# Patient Record
Sex: Male | Born: 1950 | Race: White | Hispanic: No | Marital: Married | Smoking: Former smoker
Health system: Southern US, Community
[De-identification: ages and names within clinical notes are randomized; demographics above are authoritative.]

## PROBLEM LIST (undated history)

## (undated) DIAGNOSIS — I10 Essential (primary) hypertension: Secondary | ICD-10-CM

## (undated) DIAGNOSIS — G629 Polyneuropathy, unspecified: Secondary | ICD-10-CM

## (undated) DIAGNOSIS — E119 Type 2 diabetes mellitus without complications: Secondary | ICD-10-CM

## (undated) DIAGNOSIS — I340 Nonrheumatic mitral (valve) insufficiency: Secondary | ICD-10-CM

## (undated) HISTORY — PX: BACK SURGERY: SHX140

---

## 2017-02-04 ENCOUNTER — Emergency Department (HOSPITAL_BASED_OUTPATIENT_CLINIC_OR_DEPARTMENT_OTHER): Payer: Medicare PPO

## 2017-02-04 ENCOUNTER — Observation Stay (HOSPITAL_BASED_OUTPATIENT_CLINIC_OR_DEPARTMENT_OTHER)
Admission: EM | Admit: 2017-02-04 | Discharge: 2017-02-06 | Disposition: A | Discharge status: Home / Self Care | Payer: Medicare PPO | Attending: Internal Medicine | Admitting: Internal Medicine

## 2017-02-04 ENCOUNTER — Encounter (HOSPITAL_BASED_OUTPATIENT_CLINIC_OR_DEPARTMENT_OTHER): Payer: Self-pay | Admitting: Emergency Medicine

## 2017-02-04 DIAGNOSIS — Z885 Allergy status to narcotic agent status: Secondary | ICD-10-CM | POA: Diagnosis not present

## 2017-02-04 DIAGNOSIS — Z880 Allergy status to penicillin: Secondary | ICD-10-CM | POA: Diagnosis not present

## 2017-02-04 DIAGNOSIS — J449 Chronic obstructive pulmonary disease, unspecified: Secondary | ICD-10-CM | POA: Insufficient documentation

## 2017-02-04 DIAGNOSIS — I252 Old myocardial infarction: Secondary | ICD-10-CM | POA: Diagnosis not present

## 2017-02-04 DIAGNOSIS — I959 Hypotension, unspecified: Secondary | ICD-10-CM | POA: Diagnosis not present

## 2017-02-04 DIAGNOSIS — Z87891 Personal history of nicotine dependence: Secondary | ICD-10-CM | POA: Insufficient documentation

## 2017-02-04 DIAGNOSIS — R55 Syncope and collapse: Secondary | ICD-10-CM | POA: Diagnosis present

## 2017-02-04 DIAGNOSIS — F32A Depression, unspecified: Secondary | ICD-10-CM | POA: Diagnosis present

## 2017-02-04 DIAGNOSIS — Z6836 Body mass index (BMI) 36.0-36.9, adult: Secondary | ICD-10-CM | POA: Insufficient documentation

## 2017-02-04 DIAGNOSIS — Y92009 Unspecified place in unspecified non-institutional (private) residence as the place of occurrence of the external cause: Secondary | ICD-10-CM | POA: Diagnosis not present

## 2017-02-04 DIAGNOSIS — I2510 Atherosclerotic heart disease of native coronary artery without angina pectoris: Secondary | ICD-10-CM | POA: Diagnosis not present

## 2017-02-04 DIAGNOSIS — S92911A Unspecified fracture of right toe(s), initial encounter for closed fracture: Secondary | ICD-10-CM | POA: Diagnosis not present

## 2017-02-04 DIAGNOSIS — F329 Major depressive disorder, single episode, unspecified: Secondary | ICD-10-CM | POA: Insufficient documentation

## 2017-02-04 DIAGNOSIS — I10 Essential (primary) hypertension: Secondary | ICD-10-CM | POA: Diagnosis present

## 2017-02-04 DIAGNOSIS — E871 Hypo-osmolality and hyponatremia: Secondary | ICD-10-CM | POA: Insufficient documentation

## 2017-02-04 DIAGNOSIS — S91119A Laceration without foreign body of unspecified toe without damage to nail, initial encounter: Principal | ICD-10-CM | POA: Insufficient documentation

## 2017-02-04 DIAGNOSIS — E860 Dehydration: Secondary | ICD-10-CM | POA: Diagnosis present

## 2017-02-04 DIAGNOSIS — D631 Anemia in chronic kidney disease: Secondary | ICD-10-CM | POA: Insufficient documentation

## 2017-02-04 DIAGNOSIS — S91311A Laceration without foreign body, right foot, initial encounter: Secondary | ICD-10-CM

## 2017-02-04 DIAGNOSIS — E669 Obesity, unspecified: Secondary | ICD-10-CM | POA: Insufficient documentation

## 2017-02-04 DIAGNOSIS — N179 Acute kidney failure, unspecified: Secondary | ICD-10-CM | POA: Diagnosis not present

## 2017-02-04 DIAGNOSIS — I129 Hypertensive chronic kidney disease with stage 1 through stage 4 chronic kidney disease, or unspecified chronic kidney disease: Secondary | ICD-10-CM | POA: Diagnosis not present

## 2017-02-04 DIAGNOSIS — R42 Dizziness and giddiness: Secondary | ICD-10-CM

## 2017-02-04 DIAGNOSIS — W01190A Fall on same level from slipping, tripping and stumbling with subsequent striking against furniture, initial encounter: Secondary | ICD-10-CM | POA: Diagnosis not present

## 2017-02-04 DIAGNOSIS — N183 Chronic kidney disease, stage 3 unspecified: Secondary | ICD-10-CM | POA: Diagnosis present

## 2017-02-04 DIAGNOSIS — E1122 Type 2 diabetes mellitus with diabetic chronic kidney disease: Secondary | ICD-10-CM | POA: Insufficient documentation

## 2017-02-04 DIAGNOSIS — E1159 Type 2 diabetes mellitus with other circulatory complications: Secondary | ICD-10-CM

## 2017-02-04 DIAGNOSIS — Z882 Allergy status to sulfonamides status: Secondary | ICD-10-CM | POA: Diagnosis not present

## 2017-02-04 DIAGNOSIS — N400 Enlarged prostate without lower urinary tract symptoms: Secondary | ICD-10-CM | POA: Diagnosis not present

## 2017-02-04 DIAGNOSIS — E1169 Type 2 diabetes mellitus with other specified complication: Secondary | ICD-10-CM

## 2017-02-04 HISTORY — DX: Polyneuropathy, unspecified: G62.9

## 2017-02-04 HISTORY — DX: Type 2 diabetes mellitus without complications: E11.9

## 2017-02-04 HISTORY — DX: Nonrheumatic mitral (valve) insufficiency: I34.0

## 2017-02-04 HISTORY — DX: Essential (primary) hypertension: I10

## 2017-02-04 LAB — TROPONIN I: Troponin I: <0.03 ng/mL (ref <0.03)

## 2017-02-04 LAB — CBC
HCT: 40.7 % (ref 39.0–52.0)
Hemoglobin: 13.8 g/dL (ref 13.0–17.0)
MCH: 28 pg (ref 26.0–34.0)
MCHC: 33.9 g/dL (ref 30.0–36.0)
MCV: 82.6 fL (ref 78.0–100.0)
PLATELETS: 470 10*3/uL — AB (ref 150–400)
RBC: 4.93 MIL/uL (ref 4.22–5.81)
RDW: 14.8 % (ref 11.5–15.5)
WBC: 9.1 10*3/uL (ref 4.0–10.5)

## 2017-02-04 LAB — I-STAT CG4 LACTIC ACID, ED
Lactic Acid, Venous: 1.24 mmol/L (ref 0.5–1.9)
Lactic Acid, Venous: 2.04 mmol/L (ref 0.5–1.9)

## 2017-02-04 LAB — BASIC METABOLIC PANEL
Anion gap: 15 (ref 5–15)
BUN: 39 mg/dL — ABNORMAL HIGH (ref 6–20)
CALCIUM: 9.5 mg/dL (ref 8.9–10.3)
CO2: 24 mmol/L (ref 22–32)
CREATININE: 2.68 mg/dL — AB (ref 0.61–1.24)
Chloride: 90 mmol/L — ABNORMAL LOW (ref 101–111)
GFR calc Af Amer: 27 mL/min — ABNORMAL LOW (ref >60)
GFR calc non Af Amer: 23 mL/min — ABNORMAL LOW (ref >60)
GLUCOSE: 198 mg/dL — AB (ref 65–99)
Potassium: 3.9 mmol/L (ref 3.5–5.1)
Sodium: 129 mmol/L — ABNORMAL LOW (ref 135–145)

## 2017-02-04 LAB — CBG MONITORING, ED: GLUCOSE-CAPILLARY: 213 mg/dL — AB (ref 65–99)

## 2017-02-04 MED ORDER — SODIUM CHLORIDE 0.9 % IV BOLUS (SEPSIS)
500.0000 mL | Freq: Once | INTRAVENOUS | Status: AC
  Administered 2017-02-04: 500 mL via INTRAVENOUS

## 2017-02-04 MED ORDER — LIDOCAINE HCL (PF) 1 % IJ SOLN
5.0000 mL | Freq: Once | INTRAMUSCULAR | Status: AC
  Administered 2017-02-04: 5 mL
  Filled 2017-02-04: qty 5

## 2017-02-04 NOTE — ED Notes (Signed)
Back from xray, no changes. Alert, NAD, calm, interactive, resps e/u, speaking in clear complete sentences, no dyspnea noted, skin W&D, VSS, (denies: pain, HA, nausea, dizziness or visual changes). Family at [facility]

## 2017-02-04 NOTE — ED Notes (Addendum)
Alert, NAD, calm, interactive, resps e/u, speaking in clear complete sentences, no dyspnea noted, skin W&D, states, "always sob", (denies: pain, nausea, dizziness or visual changes). Family at [facility]. Pt in [place].

## 2017-02-04 NOTE — ED Triage Notes (Addendum)
Patient states that he become dizzy about 45 minutes ago and fell. He reports that he is still dizzy. He sustained  An injury to his right foot after falling onto the table when he was dizzy. Patient has noted low BP on triage assessment. Patient is awake alert and oriented.

## 2017-02-04 NOTE — ED Notes (Signed)
Back from CT, EDP into room, no changes, alert, NAD, calm, interactive.

## 2017-02-04 NOTE — ED Provider Notes (Signed)
Emergency Department Provider Note  By signing my name below, I, Avnee Patel, attest that this documentation has been prepared under the direction and in the presence of Maia Plan, MD  Electronically Signed: Clovis Pu, ED Scribe. 02/04/17. 8:55 PM.  I have reviewed the triage vital signs and the nursing notes.   HISTORY  Chief Complaint Dizziness   HPI Harold Lewis is a 66 y.o. male, with a PMHx of DM, HTN, mitral incompetence and neuropathy, complaining of acute onset, moderate right foot pain with an associated laceration s/p a fall which occurred 45 minutes PTA. Pt states he stood up from his recliner, suddenly began to feel lightheaded, caught his foot on a table and fell forwards. He reports ongoing lightheadedness secondary to nicotine withdrawal x 7 weeks. He notes he was put on home oxygen last week. Pt states he is compliant with his blood pressure medication. No alleviating factors noted. Pt denies chest pain, a cough, fevers, chills, abdominal pain, back pain, dysuria, nausea, vomiting, diarrhea, weakness, numbness or any other associated symptoms. Pt also denies a hx of MI or new medication use. No other complaints noted at this time.   Past Medical History:  Diagnosis Date  . Diabetes mellitus without complication (HCC)   . Hypertension   . MI (mitral incompetence)   . Neuropathy     Patient Active Problem List   Diagnosis Date Noted  . Postural dizziness with presyncope 02/05/2017  . COPD (chronic obstructive pulmonary disease) (HCC) 02/05/2017  . Depression 02/05/2017  . CAD (coronary artery disease) 02/05/2017  . Laceration of right foot   . Hypotension   . Hyponatremia 02/04/2017  . Acute renal failure superimposed on stage 3 chronic kidney disease (HCC) 02/04/2017  . Pre-syncope 02/04/2017  . Dehydration 02/04/2017  . DM type 2 causing CKD stage 3 (HCC) 02/04/2017  . Essential hypertension 02/04/2017    Past Surgical History:  Procedure  Laterality Date  . BACK SURGERY        Allergies Sulfa antibiotics; Sulfasalazine; Codeine; and Penicillins  History reviewed. No pertinent family history.  Social History Social History  Substance Use Topics  . Smoking status: Former Games developer  . Smokeless tobacco: Never Used  . Alcohol use Yes     Comment: occ    Review of Systems  Constitutional: No fever/chills Eyes: No visual changes. ENT: No sore throat. Cardiovascular: Denies chest pain. Positive near-syncope.  Respiratory: Denies shortness of breath. Gastrointestinal: No abdominal pain.  No nausea, no vomiting.  No diarrhea.  No constipation. Genitourinary: Negative for dysuria. Musculoskeletal: Negative for back pain. Positive right toe pain.  Skin: Negative for rash. Positive toe bleeding and laceration.  Neurological: Negative for headaches, focal weakness or numbness.  10-point ROS otherwise negative.  ____________________________________________   PHYSICAL EXAM:  VITAL SIGNS: ED Triage Vitals [02/04/17 2023]  Enc Vitals Group     BP (!) 88/57     Pulse Rate 95     Resp 18     Temp 98.3 F (36.8 C)     Temp Source Oral     SpO2 97 %     Weight 253 lb (114.8 kg)     Height 5\' 9"  (1.753 m)   Constitutional: Alert and oriented. Well appearing and in no acute distress. Eyes: Conjunctivae are normal. PERRL.  Head: Atraumatic. Nose: No congestion/rhinnorhea. Mouth/Throat: Mucous membranes are very dry.  Neck: No stridor.   Cardiovascular: Normal rate, regular rhythm. Good peripheral circulation. Grossly normal heart sounds.  Respiratory: Normal respiratory effort.  No retractions. Lungs CTAB. Gastrointestinal: Soft and nontender. No distention.  Musculoskeletal: No lower extremity tenderness nor edema. No gross deformities of extremities. Neurologic:  Normal speech and language. No gross focal neurologic deficits are appreciated. No pronator drift. Normal CN exam 2-12 Skin:  Skin is warm and dry. No  rash noted. 3 cm right little toe laceration extending from plantar surface of toe to medial aspect of toe.  Psychiatric: Mood and affect are normal. Speech and behavior are normal.  ____________________________________________   LABS (all labs ordered are listed, but only abnormal results are displayed)  Labs Reviewed  BASIC METABOLIC PANEL - Abnormal; Notable for the following:       Result Value   Sodium 129 (*)    Chloride 90 (*)    Glucose, Bld 198 (*)    BUN 39 (*)    Creatinine, Ser 2.68 (*)    GFR calc non Af Amer 23 (*)    GFR calc Af Amer 27 (*)    All other components within normal limits  CBC - Abnormal; Notable for the following:    Platelets 470 (*)    All other components within normal limits  OSMOLALITY, URINE - Abnormal; Notable for the following:    Osmolality, Ur 263 (*)    All other components within normal limits  BASIC METABOLIC PANEL - Abnormal; Notable for the following:    Sodium 132 (*)    Chloride 99 (*)    Glucose, Bld 205 (*)    BUN 34 (*)    Creatinine, Ser 1.99 (*)    Calcium 8.4 (*)    GFR calc non Af Amer 33 (*)    GFR calc Af Amer 38 (*)    All other components within normal limits  CBC - Abnormal; Notable for the following:    Hemoglobin 12.2 (*)    HCT 36.7 (*)    All other components within normal limits  GLUCOSE, CAPILLARY - Abnormal; Notable for the following:    Glucose-Capillary 164 (*)    All other components within normal limits  GLUCOSE, CAPILLARY - Abnormal; Notable for the following:    Glucose-Capillary 214 (*)    All other components within normal limits  GLUCOSE, CAPILLARY - Abnormal; Notable for the following:    Glucose-Capillary 178 (*)    All other components within normal limits  CBG MONITORING, ED - Abnormal; Notable for the following:    Glucose-Capillary 213 (*)    All other components within normal limits  I-STAT CG4 LACTIC ACID, ED - Abnormal; Notable for the following:    Lactic Acid, Venous 2.04 (*)     All other components within normal limits  URINALYSIS, ROUTINE W REFLEX MICROSCOPIC  TROPONIN I  SODIUM, URINE, RANDOM  CREATININE, URINE, RANDOM  TROPONIN I  OSMOLALITY  TSH  ETHANOL  RAPID URINE DRUG SCREEN, HOSP PERFORMED  PROTIME-INR  TROPONIN I  TROPONIN I  UREA NITROGEN, URINE  I-STAT CG4 LACTIC ACID, ED   ____________________________________________  EKG   EKG Interpretation  Date/Time:  Sunday February 04 2017 20:34:36 EDT Ventricular Rate:  98 PR Interval:  182 QRS Duration: 92 QT Interval:  350 QTC Calculation: 446 R Axis:   118 Text Interpretation:  Normal sinus rhythm Incomplete right bundle branch block Possible Right ventricular hypertrophy Cannot rule out Inferior infarct , age undetermined Abnormal ECG No STEMI. No old for comparison.  Confirmed by Miryah Ralls MD, Azha Constantin [phone]) on 02/04/2017 8:59:54 PM  ____________________________________________  RADIOLOGY  Dg Chest 2 View  Result Date: 02/04/2017 CLINICAL DATA:  Acute onset of dyspnea.  No fever. EXAM: CHEST  2 VIEW COMPARISON:  12/28/2016 CXR FINDINGS: The heart size and mediastinal contours are within normal limits. Mild emphysematous hyperinflation of the lungs without pneumonic consolidation or CHF. No pneumothorax nor effusion. No acute nor suspicious osseous abnormality. IMPRESSION: No active cardiopulmonary disease. Electronically Signed   By: Tollie Eth M.D.   On: 02/04/2017 22:12   Ct Head Wo Contrast  Result Date: 02/04/2017 CLINICAL DATA:  Dizziness and fall EXAM: CT HEAD WITHOUT CONTRAST TECHNIQUE: Contiguous axial images were obtained from the base of the skull through the vertex without intravenous contrast. COMPARISON:  None. FINDINGS: Brain: No mass lesion, intraparenchymal hemorrhage or extra-axial collection. No evidence of acute cortical infarct. Brain parenchyma and CSF-containing spaces are normal for age. Vascular: No hyperdense vessel or unexpected calcification. Skull: Normal  visualized skull base, calvarium and extracranial soft tissues. Sinuses/Orbits: No sinus fluid levels or advanced mucosal thickening. No mastoid effusion. Normal orbits. IMPRESSION: Normal head CT for age. Electronically Signed   By: Deatra Robinson M.D.   On: 02/04/2017 21:04   Dg Foot Complete Right  Result Date: 02/04/2017 CLINICAL DATA:  Right foot trauma with laceration to right pinky toe, pain and swelling. EXAM: RIGHT FOOT COMPLETE - 3+ VIEW COMPARISON:  None. FINDINGS: There is an acute closed fracture of the head of the right fifth proximal phalanx along its medial aspect cough with extension of the fracture into the PIP joint. No dislocation noted. Mild osteoarthritic joint space narrowing of the first MTP articulation. No radiopaque foreign body is noted. Calcaneal enthesophytes are present along the plantar and dorsal aspect. The ankle, subtalar as well as midfoot articulations are maintained. IMPRESSION: Acute, closed, intra-articular fracture involving the head of right fifth proximal phalanx extending into the PIP joint. Electronically Signed   By: Tollie Eth M.D.   On: 02/04/2017 22:17    ____________________________________________   PROCEDURES  Procedure(s) performed:   Marland KitchenMarland KitchenLaceration Repair Date/Time: 02/04/2017 11:34 PM Performed by: Toiya Morrish G Authorized by: Maia Plan   Consent:    Consent obtained:  Verbal   Consent given by:  Patient   Risks discussed:  Infection, pain, retained foreign body, tendon damage, vascular damage, poor wound healing, poor cosmetic result, need for additional repair and nerve damage   Alternatives discussed:  Delayed treatment and no treatment Anesthesia (see MAR for exact dosages):    Anesthesia method:  Local infiltration   Local anesthetic:  Lidocaine 1% w/o epi Laceration details:    Location:  Toe   Toe location:  R little toe   Length (cm):  2 Repair type:    Repair type:  Intermediate Pre-procedure details:    Preparation:   Patient was prepped and draped in usual sterile fashion Exploration:    Hemostasis achieved with:  Direct pressure   Wound exploration: wound explored through full range of motion and entire depth of wound probed and visualized     Wound extent: underlying fracture     Wound extent: no foreign bodies/material noted, no nerve damage noted, no tendon damage noted and no vascular damage noted     Contaminated: no   Treatment:    Area cleansed with:  Betadine and saline   Amount of cleaning:  Extensive   Visualized foreign bodies/material removed: no   Skin repair:    Repair method:  Sutures   Suture size:  3-0  Suture material:  Prolene   Suture technique:  Simple interrupted   Number of sutures:  3 Approximation:    Approximation:  Close   Vermilion border: well-aligned   Post-procedure details:    Dressing:  Antibiotic ointment   Patient tolerance of procedure:  Tolerated well, no immediate complications    CRITICAL CARE Performed by: Maia Plan Total critical care time: 30 minutes Critical care time was exclusive of separately billable procedures and treating other patients. Critical care was necessary to treat or prevent imminent or life-threatening deterioration. Critical care was time spent personally by me on the following activities: development of treatment plan with patient and/or surrogate as well as nursing, discussions with consultants, evaluation of patient's response to treatment, examination of patient, obtaining history from patient or surrogate, ordering and performing treatments and interventions, ordering and review of laboratory studies, ordering and review of radiographic studies, pulse oximetry and re-evaluation of patient's condition.  Alona Bene, MD Emergency Medicine  ____________________________________________   INITIAL IMPRESSION / ASSESSMENT AND PLAN / ED COURSE  Pertinent labs & imaging results that were available during my care of the  patient were reviewed by me and considered in my medical decision making (see chart for details).  Patient presents to the emergency department for evaluation of sudden onset lightheadedness. He was hypotensive on arrival but awake and alert. No focal neurological deficits. No vertigo or other symptoms of stroke. Plan for IV fluids, labs, EKG, chest x-ray. Patient has injury to the right fifth toe. We'll obtain x-ray of that area in addition to CT scan of the head.   10:21 PM In review of the patient's past medical history in Care Everywhere he has known chronic kidney disease with creatinine occasionally elevated above 2 but Cr from 12/17/2016 of 1.2. Recent PCP note describes LE edema and refill of Lasix 40 mg PO BID. Unclear if this is re-starting this medication or continuing. I suspect that the patient has AKI from over-diuresis. Given IVF here with improved BP. Plan for admission for IVF and creatinine trending with IVF. Will repair toe laceration after x-ray.   The patient is noted to have a MAP's <65/ SBP's <90. With the current information available to me, I don't think the patient is in septic shock. The MAP's <65/ SBP's <90, is related to an acute condition that is not due to an infection. Acute renal failure. .  10:44 PM Discussed toe fracture with Dr. Aundria Rud with ortho. Plan for washout at bedside with laceration repair and Keflex. Can WBAT with normal shoe.   11:35 PM Discussed patient's case with Hospitlaist, Dr. Adela Glimpse. Patient and family (if present) updated with plan. Care transferred to hospitalist service.  I reviewed all nursing notes, vitals, pertinent old records, EKGs, labs, imaging (as available).  12:07 AM Lactate improving along with BP. Continue IVF and anticipate admission for IVF and observation.   ____________________________________________  FINAL CLINICAL IMPRESSION(S) / ED DIAGNOSES  Final diagnoses:  AKI (acute kidney injury) (HCC)  Hypotension,  unspecified hypotension type  Laceration of right foot, initial encounter  Near syncope     MEDICATIONS GIVEN DURING THIS VISIT:  Medications  ondansetron (ZOFRAN) injection 4 mg (not administered)  sodium chloride flush (NS) 0.9 % injection 3 mL (3 mLs Intravenous Not Given 02/05/17 1000)  acetaminophen (TYLENOL) tablet 650 mg (650 mg Oral Given 02/05/17 1157)    Or  acetaminophen (TYLENOL) suppository 650 mg ( Rectal See Alternative 02/05/17 1157)  senna-docusate (Senokot-S) tablet 1 tablet (not  administered)  zolpidem (AMBIEN) tablet 5 mg (not administered)  amitriptyline (ELAVIL) tablet 100 mg (not administered)  clopidogrel (PLAVIX) tablet 75 mg (75 mg Oral Given 02/05/17 0911)  atorvastatin (LIPITOR) tablet 5 mg (not administered)  finasteride (PROSCAR) tablet 5 mg (5 mg Oral Given 02/05/17 0912)  nitroGLYCERIN (NITROSTAT) SL tablet 0.4 mg (not administered)  mometasone-formoterol (DULERA) 100-5 MCG/ACT inhaler 2 puff (2 puffs Inhalation Given 02/05/17 0907)  citalopram (CELEXA) tablet 20 mg (20 mg Oral Given 02/05/17 0911)  albuterol (PROVENTIL) (2.5 MG/3ML) 0.083% nebulizer solution 2.5 mg (not administered)  insulin aspart (novoLOG) injection 0-9 Units (2 Units Subcutaneous Given 02/05/17 1158)  insulin aspart (novoLOG) injection 0-5 Units (not administered)  ipratropium-albuterol (DUONEB) 0.5-2.5 (3) MG/3ML nebulizer solution 3 mL (not administered)  nicotine (NICODERM CQ - dosed in mg/24 hours) patch 21 mg (not administered)  sodium chloride 0.9 % bolus 500 mL (0 mLs Intravenous Stopped 02/04/17 2152)  lidocaine (PF) (XYLOCAINE) 1 % injection 5 mL (5 mLs Infiltration Given by Other 02/04/17 2226)  sodium chloride 0.9 % bolus 500 mL (0 mLs Intravenous Stopped 02/04/17 2347)  0.9 %  sodium chloride infusion ( Intravenous Transfusing/Transfer 02/05/17 0125)  0.9 %  sodium chloride infusion ( Intravenous Stopped 02/05/17 0800)     NEW OUTPATIENT MEDICATIONS STARTED DURING THIS  VISIT:  None   Note:  This document was prepared using Dragon voice recognition software and may include unintentional dictation errors.  Alona Bene, MD Emergency Medicine  I personally performed the services described in this documentation, which was scribed in my presence. The recorded information has been reviewed and is accurate.       Maia Plan, MD 02/05/17 1308

## 2017-02-04 NOTE — ED Notes (Signed)
Not in room, pt in xray. 

## 2017-02-04 NOTE — ED Notes (Signed)
Dr. Jacqulyn Bath at [facility] preparing to suture.

## 2017-02-05 DIAGNOSIS — I959 Hypotension, unspecified: Secondary | ICD-10-CM | POA: Diagnosis present

## 2017-02-05 DIAGNOSIS — F32A Depression, unspecified: Secondary | ICD-10-CM | POA: Diagnosis present

## 2017-02-05 DIAGNOSIS — N179 Acute kidney failure, unspecified: Secondary | ICD-10-CM | POA: Diagnosis not present

## 2017-02-05 DIAGNOSIS — N183 Chronic kidney disease, stage 3 (moderate): Secondary | ICD-10-CM | POA: Diagnosis not present

## 2017-02-05 DIAGNOSIS — R42 Dizziness and giddiness: Secondary | ICD-10-CM

## 2017-02-05 DIAGNOSIS — S91311A Laceration without foreign body, right foot, initial encounter: Secondary | ICD-10-CM | POA: Diagnosis not present

## 2017-02-05 DIAGNOSIS — F329 Major depressive disorder, single episode, unspecified: Secondary | ICD-10-CM | POA: Diagnosis present

## 2017-02-05 DIAGNOSIS — I10 Essential (primary) hypertension: Secondary | ICD-10-CM | POA: Diagnosis not present

## 2017-02-05 DIAGNOSIS — S91119A Laceration without foreign body of unspecified toe without damage to nail, initial encounter: Secondary | ICD-10-CM | POA: Diagnosis not present

## 2017-02-05 DIAGNOSIS — R55 Syncope and collapse: Secondary | ICD-10-CM

## 2017-02-05 DIAGNOSIS — E1122 Type 2 diabetes mellitus with diabetic chronic kidney disease: Secondary | ICD-10-CM | POA: Diagnosis not present

## 2017-02-05 DIAGNOSIS — I2510 Atherosclerotic heart disease of native coronary artery without angina pectoris: Secondary | ICD-10-CM | POA: Diagnosis present

## 2017-02-05 DIAGNOSIS — J449 Chronic obstructive pulmonary disease, unspecified: Secondary | ICD-10-CM | POA: Diagnosis present

## 2017-02-05 DIAGNOSIS — E860 Dehydration: Secondary | ICD-10-CM | POA: Diagnosis not present

## 2017-02-05 LAB — URINALYSIS, ROUTINE W REFLEX MICROSCOPIC
Bilirubin Urine: NEGATIVE
Glucose, UA: NEGATIVE mg/dL
Hgb urine dipstick: NEGATIVE
KETONES UR: NEGATIVE mg/dL
LEUKOCYTES UA: NEGATIVE
NITRITE: NEGATIVE
PROTEIN: NEGATIVE mg/dL
Specific Gravity, Urine: 1.011 (ref 1.005–1.030)
pH: 5 (ref 5.0–8.0)

## 2017-02-05 LAB — RAPID URINE DRUG SCREEN, HOSP PERFORMED
AMPHETAMINES: NOT DETECTED
BENZODIAZEPINES: NOT DETECTED
Barbiturates: NOT DETECTED
Cocaine: NOT DETECTED
OPIATES: NOT DETECTED
Tetrahydrocannabinol: NOT DETECTED

## 2017-02-05 LAB — GLUCOSE, CAPILLARY
Glucose-Capillary: 164 mg/dL — ABNORMAL HIGH (ref 65–99)
Glucose-Capillary: 214 mg/dL — ABNORMAL HIGH (ref 65–99)
Glucose-Capillary: 178 mg/dL — ABNORMAL HIGH (ref 65–99)
Glucose-Capillary: 167 mg/dL — ABNORMAL HIGH (ref 65–99)
Glucose-Capillary: 146 mg/dL — ABNORMAL HIGH (ref 65–99)

## 2017-02-05 LAB — BASIC METABOLIC PANEL
Anion gap: 8 (ref 5–15)
BUN: 34 mg/dL — AB (ref 6–20)
CHLORIDE: 99 mmol/L — AB (ref 101–111)
CO2: 25 mmol/L (ref 22–32)
Calcium: 8.4 mg/dL — ABNORMAL LOW (ref 8.9–10.3)
Creatinine, Ser: 1.99 mg/dL — ABNORMAL HIGH (ref 0.61–1.24)
GFR calc Af Amer: 38 mL/min — ABNORMAL LOW (ref >60)
GFR calc non Af Amer: 33 mL/min — ABNORMAL LOW (ref >60)
Glucose, Bld: 205 mg/dL — ABNORMAL HIGH (ref 65–99)
Potassium: 3.8 mmol/L (ref 3.5–5.1)
Sodium: 132 mmol/L — ABNORMAL LOW (ref 135–145)

## 2017-02-05 LAB — CBC
HEMATOCRIT: 36.7 % — AB (ref 39.0–52.0)
Hemoglobin: 12.2 g/dL — ABNORMAL LOW (ref 13.0–17.0)
MCH: 27 pg (ref 26.0–34.0)
MCHC: 33.2 g/dL (ref 30.0–36.0)
MCV: 81.2 fL (ref 78.0–100.0)
PLATELETS: 371 10*3/uL (ref 150–400)
RBC: 4.52 MIL/uL (ref 4.22–5.81)
RDW: 14.6 % (ref 11.5–15.5)
WBC: 7.3 10*3/uL (ref 4.0–10.5)

## 2017-02-05 LAB — OSMOLALITY: Osmolality: 292 mOsm/kg (ref 275–295)

## 2017-02-05 LAB — TROPONIN I: Troponin I: <0.03 ng/mL (ref <0.03)

## 2017-02-05 LAB — PROTIME-INR
INR: 1.02
Prothrombin Time: 13.4 seconds (ref 11.4–15.2)

## 2017-02-05 LAB — CREATININE, URINE, RANDOM: CREATININE, URINE: 110.62 mg/dL

## 2017-02-05 LAB — ETHANOL

## 2017-02-05 LAB — OSMOLALITY, URINE: OSMOLALITY UR: 263 mosm/kg — AB (ref 300–900)

## 2017-02-05 LAB — TSH: TSH: 2.061 u[IU]/mL (ref 0.350–4.500)

## 2017-02-05 LAB — SODIUM, URINE, RANDOM: SODIUM UR: 44 mmol/L

## 2017-02-05 MED ORDER — TRAMADOL HCL 50 MG PO TABS
50.0000 mg | ORAL_TABLET | Freq: Four times a day (QID) | ORAL | Status: DC | PRN
  Administered 2017-02-05 (×2): 50 mg via ORAL
  Filled 2017-02-05 (×3): qty 1

## 2017-02-05 MED ORDER — SODIUM CHLORIDE 0.9 % IV SOLN
Freq: Once | INTRAVENOUS | Status: AC
  Administered 2017-02-05: 04:00:00 via INTRAVENOUS

## 2017-02-05 MED ORDER — DOUBLE ANTIBIOTIC 500-10000 UNIT/GM EX OINT
TOPICAL_OINTMENT | Freq: Two times a day (BID) | CUTANEOUS | Status: DC
  Filled 2017-02-05 (×18): qty 1

## 2017-02-05 MED ORDER — SODIUM CHLORIDE 0.9 % IV SOLN
INTRAVENOUS | Status: AC
  Administered 2017-02-05 – 2017-02-06 (×2): via INTRAVENOUS

## 2017-02-05 MED ORDER — ONDANSETRON HCL 4 MG/2ML IJ SOLN: 4.0000 mg | Freq: Three times a day (TID) | INTRAMUSCULAR | Status: DC | PRN

## 2017-02-05 MED ORDER — ZOLPIDEM TARTRATE 5 MG PO TABS
5.0000 mg | ORAL_TABLET | Freq: Every evening | ORAL | Status: DC | PRN
  Administered 2017-02-05: 5 mg via ORAL
  Filled 2017-02-05: qty 1

## 2017-02-05 MED ORDER — CLOPIDOGREL BISULFATE 75 MG PO TABS
75.0000 mg | ORAL_TABLET | Freq: Every day | ORAL | Status: DC
  Administered 2017-02-05 – 2017-02-06 (×2): 75 mg via ORAL
  Filled 2017-02-05 (×2): qty 1

## 2017-02-05 MED ORDER — ATORVASTATIN CALCIUM 10 MG PO TABS
5.0000 mg | ORAL_TABLET | Freq: Every day | ORAL | Status: DC
Start: 2017-02-05 — End: 2017-02-06
  Administered 2017-02-05: 5 mg via ORAL
  Filled 2017-02-05: qty 1

## 2017-02-05 MED ORDER — AMITRIPTYLINE HCL 25 MG PO TABS
100.0000 mg | ORAL_TABLET | Freq: Every day | ORAL | Status: DC
  Administered 2017-02-05: 100 mg via ORAL
  Filled 2017-02-05: qty 4

## 2017-02-05 MED ORDER — SODIUM CHLORIDE 0.9 % IV SOLN
Freq: Once | INTRAVENOUS | Status: AC
Start: 2017-02-05 — End: 2017-02-05
  Administered 2017-02-05: 01:00:00 via INTRAVENOUS

## 2017-02-05 MED ORDER — NITROGLYCERIN 0.4 MG SL SUBL: 0.4000 mg | SUBLINGUAL_TABLET | SUBLINGUAL | Status: DC | PRN

## 2017-02-05 MED ORDER — IPRATROPIUM-ALBUTEROL 0.5-2.5 (3) MG/3ML IN SOLN: 3.0000 mL | Freq: Four times a day (QID) | RESPIRATORY_TRACT | Status: DC | PRN

## 2017-02-05 MED ORDER — SODIUM CHLORIDE 0.9% FLUSH
3.0000 mL | Freq: Two times a day (BID) | INTRAVENOUS | Status: DC
  Administered 2017-02-05: 3 mL via INTRAVENOUS

## 2017-02-05 MED ORDER — MOMETASONE FURO-FORMOTEROL FUM 100-5 MCG/ACT IN AERO
2.0000 | INHALATION_SPRAY | Freq: Two times a day (BID) | RESPIRATORY_TRACT | Status: DC
  Administered 2017-02-05 – 2017-02-06 (×3): 2 via RESPIRATORY_TRACT
  Filled 2017-02-05: qty 8.8

## 2017-02-05 MED ORDER — DOUBLE ANTIBIOTIC 500-10000 UNIT/GM EX OINT
TOPICAL_OINTMENT | Freq: Two times a day (BID) | CUTANEOUS | Status: DC
  Administered 2017-02-05: 1 via TOPICAL
  Administered 2017-02-06: 08:00:00 via TOPICAL
  Filled 2017-02-05: qty 1

## 2017-02-05 MED ORDER — ACETAMINOPHEN 325 MG PO TABS
650.0000 mg | ORAL_TABLET | Freq: Four times a day (QID) | ORAL | Status: DC | PRN
  Administered 2017-02-05: 650 mg via ORAL
  Filled 2017-02-05: qty 2

## 2017-02-05 MED ORDER — INSULIN ASPART 100 UNIT/ML ~~LOC~~ SOLN
0.0000 [IU] | Freq: Three times a day (TID) | SUBCUTANEOUS | Status: DC
  Administered 2017-02-05 (×2): 2 [IU] via SUBCUTANEOUS
  Administered 2017-02-05: 3 [IU] via SUBCUTANEOUS
  Administered 2017-02-06: 2 [IU] via SUBCUTANEOUS
  Administered 2017-02-06: 3 [IU] via SUBCUTANEOUS

## 2017-02-05 MED ORDER — ALBUTEROL SULFATE (2.5 MG/3ML) 0.083% IN NEBU: 2.5000 mg | INHALATION_SOLUTION | RESPIRATORY_TRACT | Status: DC | PRN

## 2017-02-05 MED ORDER — INSULIN ASPART 100 UNIT/ML ~~LOC~~ SOLN: 0.0000 [IU] | Freq: Every day | SUBCUTANEOUS | Status: DC

## 2017-02-05 MED ORDER — NICOTINE 21 MG/24HR TD PT24: 21.0000 mg | MEDICATED_PATCH | Freq: Every day | TRANSDERMAL | Status: DC | PRN

## 2017-02-05 MED ORDER — IPRATROPIUM-ALBUTEROL 0.5-2.5 (3) MG/3ML IN SOLN
3.0000 mL | RESPIRATORY_TRACT | Status: DC
  Administered 2017-02-05: 3 mL via RESPIRATORY_TRACT
  Filled 2017-02-05: qty 3

## 2017-02-05 MED ORDER — FINASTERIDE 5 MG PO TABS
5.0000 mg | ORAL_TABLET | Freq: Every day | ORAL | Status: DC
  Administered 2017-02-05 – 2017-02-06 (×2): 5 mg via ORAL
  Filled 2017-02-05 (×2): qty 1

## 2017-02-05 MED ORDER — SENNOSIDES-DOCUSATE SODIUM 8.6-50 MG PO TABS: 1.0000 | ORAL_TABLET | Freq: Every evening | ORAL | Status: DC | PRN

## 2017-02-05 MED ORDER — ACETAMINOPHEN 650 MG RE SUPP: 650.0000 mg | Freq: Four times a day (QID) | RECTAL | Status: DC | PRN

## 2017-02-05 MED ORDER — CITALOPRAM HYDROBROMIDE 20 MG PO TABS
20.0000 mg | ORAL_TABLET | Freq: Every day | ORAL | Status: DC
  Administered 2017-02-05 – 2017-02-06 (×2): 20 mg via ORAL
  Filled 2017-02-05 (×2): qty 1

## 2017-02-05 NOTE — Evaluation (Signed)
Occupational Therapy Evaluation Patient Details Name: Harold Lewis MRN: [id number] DOB: 1951/07/26 Today's Date: 02/05/2017    History of Present Illness 66 y.o. male with medical history significant of hypertension, diabetes mellitus, CAD, COPD on 2 L oxygen at home, anxiety, mitral valve regurgitation, neuropathy, chronic kidney disease-stage III, BPH, leg edema, depression, who presents with presyncope and R foot injury.   Clinical Impression   This 66 y/o M presents with the above. Pt limited mostly by decreased activity tolerance/increased SOB during ADLs and functional mobility. Educated Pt on energy conservation techniques. Pt will benefit from continued acute OT services to increase endurance, safety and independence with ADLs and functional mobility. Goals are for independent to MinGuard assist.     Follow Up Recommendations  No OT follow up;Supervision - Intermittent    Equipment Recommendations  None recommended by OT           Precautions / Restrictions Restrictions Weight Bearing Restrictions: No      Mobility Bed Mobility Overal bed mobility: Modified Independent             General bed mobility comments: use of handrail for supine to sit transfer with supervision   Transfers Overall transfer level: Needs assistance Equipment used: None Transfers: Sit to/from Stand Sit to Stand: Min guard              Balance Overall balance assessment: Needs assistance Sitting-balance support: Feet supported Sitting balance-Leahy Scale: Good     Standing balance support: No upper extremity supported Standing balance-Leahy Scale: Good                             ADL either performed or assessed with clinical judgement   ADL Overall ADL's : Needs assistance/impaired Eating/Feeding: Independent;Sitting   Grooming: Wash/dry hands;Standing;Min guard   Upper Body Bathing: Set up;Sitting   Lower Body Bathing: Min guard;Sit to/from stand   Upper  Body Dressing : Sitting;Supervision/safety   Lower Body Dressing: Min guard;Sit to/from stand   Toilet Transfer: Min guard;Comfort height toilet;Grab bars;Ambulation   Toileting- Clothing Manipulation and Hygiene: Min guard;Sit to/from stand       Functional mobility during ADLs: Min guard                           Pertinent Vitals/Pain Pain Assessment: No/denies pain     Hand Dominance     Extremity/Trunk Assessment Upper Extremity Assessment Upper Extremity Assessment: Overall WFL for tasks assessed           Communication Communication Communication: No difficulties   Cognition Arousal/Alertness: Awake/alert Behavior During Therapy: WFL for tasks assessed/performed Overall Cognitive Status: Within Functional Limits for tasks assessed                                     General Comments                  Home Living Family/patient expects to be discharged to:: Private residence Living Arrangements: Spouse/significant other Available Help at Discharge: Family               Bathroom Shower/Tub: Walk-in Human resources officer: Standard     Home Equipment: Shower seat - built in          Prior Functioning/Environment Level of Independence: Independent  OT Problem List: Decreased strength;Decreased activity tolerance            OT Goals(Current goals can be found in the care plan section) Acute Rehab OT Goals Patient Stated Goal: to get back to fishing  OT Goal Formulation: With patient Time For Goal Achievement: 02/12/17 Potential to Achieve Goals: Good ADL Goals Pt Will Perform Grooming: with supervision;standing Pt Will Perform Lower Body Dressing: with min guard assist;sit to/from stand Pt Will Perform Toileting - Clothing Manipulation and hygiene: with supervision;sit to/from stand Pt/caregiver will Perform Home Exercise Program: Increased strength;Independently;Both right and left upper  extremity Additional ADL Goal #1: Pt will independently demonstrate at least one energy conservation technique during ADL task completion.  OT Frequency: Min 2X/week                             End of Session Equipment Utilized During Treatment: Gait belt  Activity Tolerance: Patient tolerated treatment well Patient left: in chair;with call bell/phone within reach  OT Visit Diagnosis: Muscle weakness (generalized) (M62.81)                Time: 1610-9604 OT Time Calculation (min): 19 min Charges:  OT General Charges $OT Visit: 1 Procedure OT Evaluation $OT Eval Low Complexity: 1 Procedure G-Codes: OT G-codes **NOT FOR INPATIENT CLASS** Functional Assessment Tool Used: Clinical judgement Functional Limitation: Self care Self Care Current Status (V4098): At least 20 percent but less than 40 percent impaired, limited or restricted Self Care Goal Status (J1914): At least 1 percent but less than 20 percent impaired, limited or restricted   Marcy Siren, OT Pager [phone] 02/05/2017   [name] 02/05/2017, 1:22 PM

## 2017-02-05 NOTE — Care Management Note (Signed)
Case Management Note  Patient Details  Name: Harold Lewis MRN: [id number] Date of Birth: 1951/10/02  Subjective/Objective: 66 y/o m admitted w/pre syncope. From home.  PT cons-await recc. Noted on 02-will monitor.                Action/Plan:d/c home.   Expected Discharge Date:                  Expected Discharge Plan:  Home/Self Care  In-House Referral:     Discharge planning Services  CM Consult  Post Acute Care Choice:    Choice offered to:     DME Arranged:    DME Agency:     HH Arranged:    HH Agency:     Status of Service:  In process, will continue to follow  If discussed at Long Length of Stay Meetings, dates discussed:    Additional Comments:  Lanier Clam, RN 02/05/2017, 11:03 AM

## 2017-02-05 NOTE — Care Management Note (Signed)
Case Management Note  Patient Details  Name: BLAIRE HODSDON MRN: [id number] Date of Birth: [birth year]  Subjective/Objective: Per attending-monitor labs for creatinine level-MD aware of Advanced Beneficiary Notice. PT-no f/u.ABN given-patient voiced understanding.  No further CM needs.                  Action/Plan:d/c home.   Expected Discharge Date:                  Expected Discharge Plan:  Home/Self Care  In-House Referral:     Discharge planning Services  CM Consult  Post Acute Care Choice:    Choice offered to:     DME Arranged:    DME Agency:     HH Arranged:    HH Agency:     Status of Service:  Completed, signed off  If discussed at [employer] of Stay Meetings, dates discussed:    Additional Comments:  Lanier Clam, RN 02/05/2017, 2:30 PM

## 2017-02-05 NOTE — Plan of Care (Signed)
66 year old male with a history of diabetes mellitus recently been started on Lasix for leg edema was sitting in recliner stood up and felt lightheaded stumbled forward and hit his toe presented to emergency department was found to have toe laceration and fracture was found to have a KI and out of a lactic acid as well as soft blood pressures initially he was given IV fluids toe was repaired troponin unremarkable he is being admitted for likely overdiuresis and AKI as well as evaluation of presyncope  Accepted to telemetry bed observation Nancylee Gaines 12:12 AM

## 2017-02-05 NOTE — H&P (Addendum)
History and Physical    THAILAND DUBE EAV:[id number] DOB: 31-Mar-1951 DOA: 02/04/2017  Referring MD/NP/PA:   PCP: [facility]   Patient coming from:  The patient is coming from home.  At baseline, pt is independent for most of ADL.     Chief Complaint: presyncope, left foot injury  HPI: Harold Lewis is a 66 y.o. male with medical history significant of hypertension, diabetes mellitus, CAD, COPD on 2 L oxygen at home, anxiety, mitral valve regurgitation, neuropathy, chronic kidney disease-stage III, BPH, leg edema, depression, who presents with presyncope and R foot injury.  pt states that pt states he stood up from his recliner, suddenly began to feel lightheaded, tripped his steps, caught his foot on a table and fell forwards, but no head and neck injury. He injured his right 5th toe with skin laceration. Not passed out. Patient denies unilateral weakness, slurred speech, vision change, hearing loss. Patient does not have chest pain, SOB, cough, fever, chills, nausea, vomiting, diarrhea, abdominal pain, symptoms of UTI. Of note, pt states that he was started with lasix 40 mg bid for leg edema since Friday. He also reports that he quit smoking and has ongoing lightheadedness secondary to nicotine withdrawal x 7 weeks.  ED Course: pt was found to have hypotension with blood pressure 82/56, which normalized after normal saline bolus in ED, negative troponin 2, WBC 9.1, lactic acid 2.04, 1.24, negative urinalysis, sodium 129, worsening renal function, temperature normal, O2 sat is 90% on room air, negative chest x-ray, negative CT head for acute intracranial abnormalities. X-ray of right foot showed acute, closed, intra-articular fracture involving the head of right fifth proximal phalanx extending into the PIP joint. Pt is placed on tele bed for obs. Ortho, Dr. Aundria Rud was consulted by EDP.  Review of Systems:   General: no fevers, chills, no changes in body weight, has  fatigue HEENT: no blurry vision, hearing changes or sore throat Respiratory: no dyspnea, coughing, wheezing CV: no chest pain, no palpitations GI: no nausea, vomiting, abdominal pain, diarrhea, constipation GU: no dysuria, burning on urination, increased urinary frequency, hematuria  Ext: no leg edema Neuro: no unilateral weakness, numbness, or tingling, no vision change or hearing loss. Has lightheadedness. Skin: no rash, has skin tear in right foot. MSK: No muscle spasm, no deformity, no limitation of range of movement in spin Heme: No easy bruising.  Travel history: No recent long distant travel.  Allergy:  Allergies  Allergen Reactions  . Sulfa Antibiotics     Other reaction(s): Other Caused Acute kidney injury  . Sulfasalazine Itching    Caused Acute kidney injury  . Codeine Nausea Only  . Penicillins Rash    Has patient had a PCN reaction causing immediate rash, facial/tongue/throat swelling, SOB or lightheadedness with hypotension: yes Has patient had a PCN reaction causing severe rash involving mucus membranes or skin necrosis: no Has patient had a PCN reaction that required hospitalization: no Has patient had a PCN reaction occurring within the last 10 years: no If all of the above answers are "NO", then may proceed with Cephalosporin use.     Past Medical History:  Diagnosis Date  . Diabetes mellitus without complication (HCC)   . Hypertension   . MI (mitral incompetence)   . Neuropathy     Past Surgical History:  Procedure Laterality Date  . BACK SURGERY      Social History:  reports that he has quit smoking. He has never used smokeless tobacco.  He reports that he drinks alcohol. He reports that he does not use drugs.  Family History: Patient is adopted, does not know family history.  Prior to Admission medications   Not on File    Physical Exam: Vitals:   02/05/17 0100 02/05/17 0115 02/05/17 0226 02/05/17 0402  BP: 108/76 115/77 133/76   Pulse: 74  73 76   Resp: Temp:   97.8 F (36.6 C)   TempSrc:   Oral   SpO2: 94% 98% 97% 95%  Weight:   110.8 kg (244 lb 4.3 oz)   Height:    (1.753 m)    General: Not in acute distress. Dry mucus and membrane. HEENT:       Eyes: PERRL, EOMI, no scleral icterus.       ENT: No discharge from the ears and nose, no pharynx injection, no tonsillar enlargement.        Neck: No JVD, no bruit, no mass felt. Heme: No neck lymph node enlargement. Cardiac: S1/S2, RRR, No murmurs, No gallops or rubs. Respiratory: No rales, wheezing, rhonchi or rubs. GI: Soft, nondistended, nontender, no rebound pain, no organomegaly, BS present. GU: No hematuria Ext: No pitting leg edema bilaterally. 2+DP/PT pulse bilaterally. Musculoskeletal: No joint deformities, No joint redness or warmth, no limitation of ROM in spin. Skin: No rashes.  Neuro: Alert, oriented X3, cranial nerves II-XII grossly intact, moves all extremities normally. Muscle strength 5/5 in all extremities, sensation to light touch intact. Brachial reflex 2+ bilaterally. Negative Babinski's sign. Normal finger to nose test. Psych: Patient is not psychotic, no suicidal or hemocidal ideation.  Labs on Admission: I have personally reviewed following labs and imaging studies  CBC:  Recent Labs Lab 02/04/17 2100 02/05/17 0538  WBC 9.1 7.3  HGB 13.8 12.2*  HCT 40.7 36.7*  MCV 82.6 81.2  PLT 470* 371   Basic Metabolic Panel:  Recent Labs Lab 02/04/17 2100  NA 129*  K 3.9  CL 90*  CO2 24  GLUCOSE 198*  BUN 39*  CREATININE 2.68*  CALCIUM 9.5   GFR: Estimated Creatinine Clearance: 32.8 mL/min (A) (by C-G formula based on SCr of 2.68 mg/dL (H)). Liver Function Tests: No results for input(s): AST, ALT, ALKPHOS, BILITOT, PROT, ALBUMIN in the last 168 hours. No results for input(s): LIPASE, AMYLASE in the last 168 hours. No results for input(s): AMMONIA in the last 168 hours. Coagulation Profile: No results for input(s): INR,  PROTIME in the last 168 hours. Cardiac Enzymes:  Recent Labs Lab 02/04/17 2100 02/04/17 2343  TROPONINI <0.03 <0.03   BNP (last 3 results) No results for input(s): PROBNP in the last 8760 hours. HbA1C: No results for input(s): HGBA1C in the last 72 hours. CBG:  Recent Labs Lab 02/04/17 2053 02/05/17 0332  GLUCAP 213* 164*   Lipid Profile: No results for input(s): CHOL, HDL, LDLCALC, TRIG, CHOLHDL, LDLDIRECT in the last 72 hours. Thyroid Function Tests: No results for input(s): TSH, T4TOTAL, FREET4, T3FREE, THYROIDAB in the last 72 hours. Anemia Panel: No results for input(s): VITAMINB12, FOLATE, FERRITIN, TIBC, IRON, RETICCTPCT in the last 72 hours. Urine analysis:    Component Value Date/Time   COLORURINE YELLOW 02/04/2017 2343   APPEARANCEUR CLEAR 02/04/2017 2343   LABSPEC 1.011 02/04/2017 2343   PHURINE 5.0 02/04/2017 2343   GLUCOSEU NEGATIVE 02/04/2017 2343   HGBUR NEGATIVE 02/04/2017 2343   BILIRUBINUR NEGATIVE 02/04/2017 2343   KETONESUR NEGATIVE 02/04/2017 2343   PROTEINUR NEGATIVE 02/04/2017 2343  NITRITE NEGATIVE 02/04/2017 2343   LEUKOCYTESUR NEGATIVE 02/04/2017 2343   Sepsis Labs: (procalcitonin:4,lacticidven:4) )No results found for this or any previous visit (from the past 240 hour(s)).   Radiological Exams on Admission: Dg Chest 2 View  Result Date: 02/04/2017 CLINICAL DATA:  Acute onset of dyspnea.  No fever. EXAM: CHEST  2 VIEW COMPARISON:  12/28/2016 CXR FINDINGS: The heart size and mediastinal contours are within normal limits. Mild emphysematous hyperinflation of the lungs without pneumonic consolidation or CHF. No pneumothorax nor effusion. No acute nor suspicious osseous abnormality. IMPRESSION: No active cardiopulmonary disease. Electronically Signed   By: Tollie Eth M.D.   On: 02/04/2017 22:12   Ct Head Wo Contrast  Result Date: 02/04/2017 CLINICAL DATA:  Dizziness and fall EXAM: CT HEAD WITHOUT CONTRAST TECHNIQUE: Contiguous  axial images were obtained from the base of the skull through the vertex without intravenous contrast. COMPARISON:  None. FINDINGS: Brain: No mass lesion, intraparenchymal hemorrhage or extra-axial collection. No evidence of acute cortical infarct. Brain parenchyma and CSF-containing spaces are normal for age. Vascular: No hyperdense vessel or unexpected calcification. Skull: Normal visualized skull base, calvarium and extracranial soft tissues. Sinuses/Orbits: No sinus fluid levels or advanced mucosal thickening. No mastoid effusion. Normal orbits. IMPRESSION: Normal head CT for age. Electronically Signed   By: Deatra Robinson M.D.   On: 02/04/2017 21:04   Dg Foot Complete Right  Result Date: 02/04/2017 CLINICAL DATA:  Right foot trauma with laceration to right pinky toe, pain and swelling. EXAM: RIGHT FOOT COMPLETE - 3+ VIEW COMPARISON:  None. FINDINGS: There is an acute closed fracture of the head of the right fifth proximal phalanx along its medial aspect cough with extension of the fracture into the PIP joint. No dislocation noted. Mild osteoarthritic joint space narrowing of the first MTP articulation. No radiopaque foreign body is noted. Calcaneal enthesophytes are present along the plantar and dorsal aspect. The ankle, subtalar as well as midfoot articulations are maintained. IMPRESSION: Acute, closed, intra-articular fracture involving the head of right fifth proximal phalanx extending into the PIP joint. Electronically Signed   By: Tollie Eth M.D.   On: 02/04/2017 22:17     EKG: Independently reviewed.  Sinus rhythm, QTC 446, T-wave inversion in V1-32, RAD, early R-wave progression, low voltage, Q waves in inferior leads.   Assessment/Plan Principal Problem:   Pre-syncope Active Problems:   Hyponatremia   Acute renal failure superimposed on stage 3 chronic kidney disease (HCC)   Dehydration   DM type 2 causing CKD stage 3 (HCC)   Essential hypertension   Laceration of right foot    Postural dizziness with presyncope   Hypotension   COPD (chronic obstructive pulmonary disease) (HCC)   Depression   CAD (coronary artery disease)   Pre-syncope: Most likely due to dehydration secondary to overuse of Lasix. Patient was hypotensive, clinically dry on examination, consistent with dehydration. Patient does not have focal neurological findings. CT head is negative for acute intracranial abnormalities. Less likely to have stroke. Patient does not have any chest pain or shortness breath, and troponin negative 2, less likely to have ACS.   -will place on tele bed for obs -Check orthostatic vital sign -PT/OT -Check UDS -Frequent neuro check -IVF: 500 cc of NS bolus x 2 and then 150 cc/h  Lceration of right foot and 5th toe Fracture:  -ortho, dr. Aundria Rud was consulted-->f/u recommendations -consult to wound care. -prn percocet for pain  Hyponatremia: Na 129. Currently her mental status normal. Most likely due  to Lasix use. -IVF as above -f/u by BMP -check TSH  AoCKD-III: Baseline Cre is 1.2 on 12/17/16, pt's Cre is 2.68 on admission. Likely due to prerenal secondary to dehydration and continuation of diruetics. ATN is also possible given his hypotension. - IVF as above - Check  FeUrea - Follow up renal function by BMP - Hold Diuretics, lasix  Elevated lactate and dehydration: 2.04 on admission, which has normalized with IV fluid. No signs for infection. No leukocytosis or fever. Most likely due to dehydration. -continue IVF as above  DM-II: Last A1c 6.6 on 05/05/15, well controled. Patient is taking glipizide at home -SSI  Essential hypertension: has hypotension -hold Bp meds.  CAD: no CP; -continue Lipitor, when necessary nitroglycerin, Plavix  COPD: stable. -Dulera inhaler and Duonebs  -prn albuterol nebs  Depression: -Amitriptyline, Celexa  BPH: stable - Continue Proscar  FormerTobacco abuse: quit recenlty -Nicotine patch prn   DVT ppx: SQ  Lovenox Code Status: Full code Family Communication: None at bed side.   Disposition Plan:  Anticipate discharge back to previous home environment Consults called:  Ortho, dr. Aundria Rud Admission status: Obs / tele    Date of Service 02/05/2017    Lorretta Harp [facility] Pager [phone]  If 7PM-7AM, please contact night-coverage [email or web address] Password [facility] 02/05/2017, 6:23 AM

## 2017-02-05 NOTE — Care Management Obs Status (Signed)
MEDICARE OBSERVATION STATUS NOTIFICATION   Patient Details  Name: CRAIGE PATELyce MRN: [id number] Date of Birth: 04-03-1951   Medicare Observation Status Notification Given:  Yes    Lanier Clam, RN 02/05/2017, 11:02 AM

## 2017-02-05 NOTE — Consult Note (Signed)
WOC Nurse wound consult note Reason for Consult: right 5th toe laceration from fall at home Wound type: trauma Pressure Injury POA: No Measurement: aprox. 2cm closed laceration with 3 sutures Wound bed: closed  Drainage (amount, consistency, odor) dried blood, scant Periwound: intact some edema Dressing procedure/placement/frequency: Antibiotic ointment to the laceration.  No other dressings needed.  Discussed POC with patient and bedside nurse.  Re consult if needed, will not follow at this time. Thanks  Chaniqua Brisby [employer], RN,CWOCN, CNS [phone])

## 2017-02-05 NOTE — Progress Notes (Signed)
Patient seen and examined. Admitted after midnight secondary to pre-syncope event. Recently started on lasix and was not drinking enough. Found to be dehydrated, hyponatremic and with AKI. Also with elevated lactic acid. Will hold on offending/nephrotoxic agents; continue IVF's and repeat orthostatic VS in am. For further details please refer to H&P written by Dr. Clyde Lundborg. Patient is hemodynamically stable and renal function improving; endorses feeling better, but still mild lightheaded.  Harold Lewis [phone]

## 2017-02-05 NOTE — Evaluation (Signed)
Physical Therapy Evaluation Patient Details Name: Harold Lewis MRN: [id number] DOB: 05-21-1951 Today's Date: 02/05/2017   History of Present Illness  66 y.o. male with medical history significant of hypertension, diabetes mellitus, CAD, COPD on 2 L oxygen at home, anxiety, mitral valve regurgitation, neuropathy, chronic kidney disease-stage III, BPH, leg edema, depression, who presents with presyncope and R  5th toe fracture and laceration.   Clinical Impression  Pt is independent with mobility, he ambulated 300' without an assistive device, no loss of balance, SaO2 95% on RA. No further PT indicated, no follow up needs, PT signing off.     Follow Up Recommendations No PT follow up    Equipment Recommendations  None recommended by PT    Recommendations for Other Services       Precautions / Restrictions Precautions Precautions: None Precaution Comments: pt denies h/o falls in past 1 year Restrictions Weight Bearing Restrictions: No      Mobility  Bed Mobility Overal bed mobility: Modified Independent             General bed mobility comments: use of handrail for supine to sit transfer with supervision   Transfers Overall transfer level: Independent Equipment used: None Transfers: Sit to/from Stand Sit to Stand: Independent            Ambulation/Gait Ambulation/Gait assistance: Independent Ambulation Distance (Feet): 300 Feet Assistive device: None Gait Pattern/deviations: WFL(Within Functional Limits)     General Gait Details: pt reports some pain R 5th toe with ambulation, pain meds requested, pt steady with no LOB, SaO2 95% on RA walking  Stairs            Wheelchair Mobility    Modified Rankin (Stroke Patients Only)       Balance Overall balance assessment: Independent Sitting-balance support: Feet supported Sitting balance-Leahy Scale: Good     Standing balance support: No upper extremity supported Standing balance-Leahy Scale: Good                               Pertinent Vitals/Pain Pain Assessment: 0-10 Pain Score: 7  Pain Location: R 5th toe Pain Descriptors / Indicators: Sore Pain Intervention(s): Limited activity within patient's tolerance;Monitored during session;Patient requesting pain meds-RN notified    Home Living Family/patient expects to be discharged to:: Private residence Living Arrangements: Spouse/significant other Available Help at Discharge: Family           Home Equipment: Shower seat - built in      Prior Function Level of Independence: Independent               Higher education careers adviser        Extremity/Trunk Assessment   Upper Extremity Assessment Upper Extremity Assessment: Overall WFL for tasks assessed    Lower Extremity Assessment Lower Extremity Assessment: Overall WFL for tasks assessed    Cervical / Trunk Assessment Cervical / Trunk Assessment: Normal  Communication   Communication: No difficulties  Cognition Arousal/Alertness: Awake/alert Behavior During Therapy: WFL for tasks assessed/performed Overall Cognitive Status: Within Functional Limits for tasks assessed                                        General Comments      Exercises     Assessment/Plan    PT Assessment Patent does not need any further PT services  PT  Problem List         PT Treatment Interventions      PT Goals (Current goals can be found in the Care Plan section)  Acute Rehab PT Goals Patient Stated Goal: to get back to fishing  PT Goal Formulation: All assessment and education complete, DC therapy    Frequency     Barriers to discharge        Co-evaluation               End of Session Equipment Utilized During Treatment: Gait belt Activity Tolerance: Patient tolerated treatment well Patient left: in bed;with call bell/phone within reach Nurse Communication: Mobility status PT Visit Diagnosis: Pain Pain - Right/Left: Right Pain - part  of body: Ankle and joints of foot    Time: 1356-1409 PT Time Calculation (min) (ACUTE ONLY): 13 min   Charges:   PT Evaluation $PT Eval Low Complexity: 1 Procedure     PT G Codes:   PT G-Codes **NOT FOR INPATIENT CLASS** Functional Assessment Tool Used: AM-PAC 6 Clicks Basic Mobility Functional Limitation: Mobility: Walking and moving around Mobility: Walking and Moving Around Current Status (Z6109): 0 percent impaired, limited or restricted Mobility: Walking and Moving Around Goal Status (U0454): 0 percent impaired, limited or restricted      Tamala Ser 02/05/2017, 2:20 PM [phone]

## 2017-02-06 DIAGNOSIS — S91311D Laceration without foreign body, right foot, subsequent encounter: Secondary | ICD-10-CM | POA: Diagnosis not present

## 2017-02-06 DIAGNOSIS — E1122 Type 2 diabetes mellitus with diabetic chronic kidney disease: Secondary | ICD-10-CM

## 2017-02-06 DIAGNOSIS — N183 Chronic kidney disease, stage 3 (moderate): Secondary | ICD-10-CM | POA: Diagnosis not present

## 2017-02-06 DIAGNOSIS — J449 Chronic obstructive pulmonary disease, unspecified: Secondary | ICD-10-CM

## 2017-02-06 DIAGNOSIS — N179 Acute kidney failure, unspecified: Secondary | ICD-10-CM | POA: Diagnosis not present

## 2017-02-06 DIAGNOSIS — R55 Syncope and collapse: Secondary | ICD-10-CM | POA: Diagnosis not present

## 2017-02-06 DIAGNOSIS — E119 Type 2 diabetes mellitus without complications: Secondary | ICD-10-CM

## 2017-02-06 DIAGNOSIS — E860 Dehydration: Secondary | ICD-10-CM

## 2017-02-06 DIAGNOSIS — S91119A Laceration without foreign body of unspecified toe without damage to nail, initial encounter: Secondary | ICD-10-CM | POA: Diagnosis not present

## 2017-02-06 DIAGNOSIS — I10 Essential (primary) hypertension: Secondary | ICD-10-CM

## 2017-02-06 DIAGNOSIS — F329 Major depressive disorder, single episode, unspecified: Secondary | ICD-10-CM | POA: Diagnosis not present

## 2017-02-06 DIAGNOSIS — E1169 Type 2 diabetes mellitus with other specified complication: Secondary | ICD-10-CM

## 2017-02-06 DIAGNOSIS — R42 Dizziness and giddiness: Secondary | ICD-10-CM

## 2017-02-06 DIAGNOSIS — E871 Hypo-osmolality and hyponatremia: Secondary | ICD-10-CM

## 2017-02-06 DIAGNOSIS — E669 Obesity, unspecified: Secondary | ICD-10-CM

## 2017-02-06 DIAGNOSIS — E1159 Type 2 diabetes mellitus with other circulatory complications: Secondary | ICD-10-CM

## 2017-02-06 LAB — CBC
HCT: 36.9 % — ABNORMAL LOW (ref 39.0–52.0)
Hemoglobin: 12.2 g/dL — ABNORMAL LOW (ref 13.0–17.0)
MCH: 27.1 pg (ref 26.0–34.0)
MCHC: 33.1 g/dL (ref 30.0–36.0)
MCV: 81.8 fL (ref 78.0–100.0)
PLATELETS: 389 10*3/uL (ref 150–400)
RBC: 4.51 MIL/uL (ref 4.22–5.81)
RDW: 14.6 % (ref 11.5–15.5)
WBC: 7.1 10*3/uL (ref 4.0–10.5)

## 2017-02-06 LAB — LACTIC ACID, PLASMA: Lactic Acid, Venous: 1.1 mmol/L (ref 0.5–1.9)

## 2017-02-06 LAB — BASIC METABOLIC PANEL WITH GFR
Anion gap: 6 (ref 5–15)
BUN: 23 mg/dL — ABNORMAL HIGH (ref 6–20)
CO2: 28 mmol/L (ref 22–32)
Calcium: 8.8 mg/dL — ABNORMAL LOW (ref 8.9–10.3)
Chloride: 103 mmol/L (ref 101–111)
Creatinine, Ser: 1.3 mg/dL — ABNORMAL HIGH (ref 0.61–1.24)
GFR calc Af Amer: >60 mL/min
GFR calc non Af Amer: 55 mL/min — ABNORMAL LOW
Glucose, Bld: 158 mg/dL — ABNORMAL HIGH (ref 65–99)
Potassium: 4.3 mmol/L (ref 3.5–5.1)
Sodium: 137 mmol/L (ref 135–145)

## 2017-02-06 LAB — GLUCOSE, CAPILLARY
Glucose-Capillary: 145 mg/dL — ABNORMAL HIGH (ref 65–99)
Glucose-Capillary: 238 mg/dL — ABNORMAL HIGH (ref 65–99)
Glucose-Capillary: 180 mg/dL — ABNORMAL HIGH (ref 65–99)

## 2017-02-06 LAB — UREA NITROGEN, URINE: Urea Nitrogen, Ur: 277 mg/dL

## 2017-02-06 MED ORDER — ACETAMINOPHEN 325 MG PO TABS: 650.0000 mg | ORAL_TABLET | Freq: Four times a day (QID) | ORAL | 0 refills | Status: AC | PRN

## 2017-02-06 MED ORDER — FINASTERIDE 5 MG PO TABS: 5.0000 mg | ORAL_TABLET | Freq: Every day | ORAL | 0 refills | Status: AC

## 2017-02-06 MED ORDER — FUROSEMIDE 40 MG PO TABS: 40.0000 mg | ORAL_TABLET | Freq: Two times a day (BID) | ORAL | Status: AC

## 2017-02-06 MED ORDER — DOUBLE ANTIBIOTIC 500-10000 UNIT/GM EX OINT: 1.0000 "application " | TOPICAL_OINTMENT | Freq: Two times a day (BID) | CUTANEOUS | Status: AC

## 2017-02-06 NOTE — Discharge Summary (Signed)
Physician Discharge Summary  Harold Lewis ZOX:[id number] DOB: 1951-02-20 DOA: 02/04/2017  PCP: [facility]  Admit date: 02/04/2017 Discharge date: 02/06/2017  Time spent: 35 minutes  Recommendations for Outpatient Follow-up:  Repeat BMET to follow electrolytes and renal function  Please reassess BP and adjust medications as needed   Discharge Diagnoses:  Principal Problem:   Pre-syncope Active Problems:   Hyponatremia   Acute renal failure superimposed on stage 3 chronic kidney disease (HCC)   Dehydration   DM type 2 causing CKD stage 3 (HCC)   Essential hypertension   Laceration of right foot   Postural dizziness with presyncope   Hypotension   COPD (chronic obstructive pulmonary disease) (HCC)   Depression   CAD (coronary artery disease)   Obesity, diabetes, and hypertension syndrome (HCC)   Discharge Condition: stable and improved. Discharge home with instructions to follow up with PCP in 10 days.  Diet recommendation: heart healthy diet   Filed Weights   02/04/17 2023 02/05/17 0226 02/06/17 0426  Weight: 114.8 kg (253 lb) 110.8 kg (244 lb 4.3 oz) 113.3 kg (249 lb 11.2 oz)    History of present illness:  66 y.o. male with medical history significant of hypertension, diabetes mellitus, CAD, COPD on 2 L oxygen at home, anxiety, mitral valve regurgitation, neuropathy, chronic kidney disease-stage III, BPH, leg edema, depression, who presents with presyncope and R foot injury.  Hospital Course:  Pre-syncope: Most likely due to dehydration secondary to overuse of Lasix.  -no abnormalities seen on telemetry or EKG -patient symptoms improved/resolved with IVF's resuscitation  -advise to maintain adequate hydration -at discharge no orthostatic VS  Lceration of right foot and 5th toe Fracture:  -Orthopedic Doctor, Dr. Aundria Rud was consulted and will follow his recommendations.  Hyponatremia: Na 129 on admission  -due to diuresis -normal  TSH -improved after stopping diuretics and given IVF's -repeat BMET at follow up to follow electrolytes trend  -Na at discharge 137  AoCKD-III: Baseline Cre is 1.2 on 12/17/16, pt's Cr is 2.68 on admission.  -most likely pre-renal, hypotension and continue use of nephrotoxic agents -after IVF's and holding nephrotoxic agents, renal function back to baseline -at discharge will continue holding Diuretics, lasix (will need adjustment to medications before restaring it again, if needed) -Will resume lisinopril  Elevated lactate and dehydration:  -lactic acid 2.04 on admission, which has normalized with IV fluid.  -advise to maintained good hydration  -no diuretics at discharge   DM-II: Last A1c 6.6 on 05/05/15, well controled.  -Patient is taking Glipizide  -advise to follow low carb diet   Essential hypertension:  -advise to follow low sodium diet -stop lasix until follow up with PCP -will resume rest home medications  -advise to maintain good hydration  CAD:  -no CP or SOB -continue Lipitor, when necessary nitroglycerin and Plavix  COPD: stable. -no wheezing and good air movement -continue home inhaler/nebulizer treatment   Depression: -no SI and no hallucinations  -continue Amitriptyline  BPH: stable -will continue Proscar  FormerTobacco abuse: quit recenlty -continue Nicotine patch prn  Obesity -Body mass index is 36.87 kg/m. -low calorie diet and exercise discussed with patient   Procedures:  See below for x-ray reports   Consultations:  None   Discharge Exam: Vitals:   02/06/17 0426 02/06/17 1111  BP:    Pulse: 69 70  Resp: 18 18  Temp: 97.5 F (36.4 C)     General: Obese, in no acute distress; denies CPa nd SOB. Neg  repeat Orthostatic VS. No nausea, no vomiting and feeling much better. Cardiovascular: S1 and S2, no rubs, no gallops, no JVD appreciated Respiratory: good air movement, no wheezing, no crackles abd: soft, NT, ND, positive  BS Extremities: with positive trace to 1 ++ edema bilaterally. Right foot skin tear, w/o surrounding erythema.  Discharge Instructions   Discharge Instructions    Diet - low sodium heart healthy    Complete by:  As directed    Discharge instructions    Complete by:  As directed    Take medications as prescribed Arrange follow up with PCP in 1 week Follow heart healthy diet Keep yourself well hydrated     Current Discharge Medication List    START taking these medications   Details  acetaminophen (TYLENOL) 325 MG tablet Take 2 tablets (650 mg total) by mouth every 6 (six) hours as needed for mild pain, fever or headache. Qty: 35 tablet, Refills: 0    finasteride (PROSCAR) 5 MG tablet Take 1 tablet (5 mg total) by mouth daily. Qty: 30 tablet, Refills: 0    polymixin-bacitracin (POLYSPORIN) 500-10000 UNIT/GM OINT ointment Apply 1 application topically 2 (two) times daily.      CONTINUE these medications which have CHANGED   Details  furosemide (LASIX) 40 MG tablet Take 1 tablet (40 mg total) by mouth 2 (two) times daily. STOP TAKING UNTIL FOLLOW UP WITH PCP; DOSE NEED TO BE ADJUSTED FOR YOU, BEFORE INITIATION OF TREATMENT.      CONTINUE these medications which have NOT CHANGED   Details  albuterol (ACCUNEB) 1.25 MG/3ML nebulizer solution Take 1.25 mg by nebulization every 6 (six) hours as needed for wheezing or shortness of breath.     albuterol (PROVENTIL HFA;VENTOLIN HFA) 108 (90 Base) MCG/ACT inhaler Inhale 2 puffs into the lungs every 6 (six) hours as needed for wheezing or shortness of breath.     amitriptyline (ELAVIL) 100 MG tablet Take 100 mg by mouth at bedtime.    atorvastatin (LIPITOR) 20 MG tablet Take 20 mg by mouth daily.    carvedilol (COREG) 12.5 MG tablet Take 12.5 mg by mouth 2 (two) times daily.    clopidogrel (PLAVIX) 75 MG tablet Take 75 mg by mouth every morning.     gabapentin (NEURONTIN) 300 MG capsule Take 300 mg by mouth 2 (two) times daily.      lisinopril (PRINIVIL,ZESTRIL) 40 MG tablet Take 40 mg by mouth daily.    metFORMIN (GLUCOPHAGE) 500 MG tablet Take 500 mg by mouth daily with breakfast.     nitroGLYCERIN (NITROSTAT) 0.4 MG SL tablet Place 0.4 mg under the tongue every 5 (five) minutes as needed for chest pain.       STOP taking these medications     amoxicillin (AMOXIL) 500 MG capsule      ibuprofen (ADVIL,MOTRIN) 800 MG tablet        Allergies  Allergen Reactions  . Sulfa Antibiotics     Other reaction(s): Other Caused Acute kidney injury  . Sulfasalazine Itching    Caused Acute kidney injury  . Codeine Nausea Only  . Penicillins Rash    Has patient had a PCN reaction causing immediate rash, facial/tongue/throat swelling, SOB or lightheadedness with hypotension: yes Has patient had a PCN reaction causing severe rash involving mucus membranes or skin necrosis: no Has patient had a PCN reaction that required hospitalization: no Has patient had a PCN reaction occurring within the last 10 years: no If all of the above answers are "NO",  then may proceed with Cephalosporin use.    Follow-up Information    [facility]. Schedule an appointment as soon as possible for a visit in 1 week(s).   Specialty:  Family Medicine Contact information: [phone] [facility] [address] Darcel Smalling [address] [place] [address] [phone]           The results of significant diagnostics from this hospitalization (including imaging, microbiology, ancillary and laboratory) are listed below for reference.    Significant Diagnostic Studies: Dg Chest 2 View  Result Date: 02/04/2017 CLINICAL DATA:  Acute onset of dyspnea.  No fever. EXAM: CHEST  2 VIEW COMPARISON:  12/28/2016 CXR FINDINGS: The heart size and mediastinal contours are within normal limits. Mild emphysematous hyperinflation of the lungs without pneumonic consolidation or CHF. No pneumothorax nor effusion. No acute nor suspicious osseous abnormality. IMPRESSION:  No active cardiopulmonary disease. Electronically Signed   By: Tollie Eth M.D.   On: 02/04/2017 22:12   Ct Head Wo Contrast  Result Date: 02/04/2017 CLINICAL DATA:  Dizziness and fall EXAM: CT HEAD WITHOUT CONTRAST TECHNIQUE: Contiguous axial images were obtained from the base of the skull through the vertex without intravenous contrast. COMPARISON:  None. FINDINGS: Brain: No mass lesion, intraparenchymal hemorrhage or extra-axial collection. No evidence of acute cortical infarct. Brain parenchyma and CSF-containing spaces are normal for age. Vascular: No hyperdense vessel or unexpected calcification. Skull: Normal visualized skull base, calvarium and extracranial soft tissues. Sinuses/Orbits: No sinus fluid levels or advanced mucosal thickening. No mastoid effusion. Normal orbits. IMPRESSION: Normal head CT for age. Electronically Signed   By: Deatra Robinson M.D.   On: 02/04/2017 21:04   Dg Foot Complete Right  Result Date: 02/04/2017 CLINICAL DATA:  Right foot trauma with laceration to right pinky toe, pain and swelling. EXAM: RIGHT FOOT COMPLETE - 3+ VIEW COMPARISON:  None. FINDINGS: There is an acute closed fracture of the head of the right fifth proximal phalanx along its medial aspect cough with extension of the fracture into the PIP joint. No dislocation noted. Mild osteoarthritic joint space narrowing of the first MTP articulation. No radiopaque foreign body is noted. Calcaneal enthesophytes are present along the plantar and dorsal aspect. The ankle, subtalar as well as midfoot articulations are maintained. IMPRESSION: Acute, closed, intra-articular fracture involving the head of right fifth proximal phalanx extending into the PIP joint. Electronically Signed   By: Tollie Eth M.D.   On: 02/04/2017 22:17   Labs: Basic Metabolic Panel:  Recent Labs Lab 02/04/17 2100 02/05/17 0538 02/06/17 0617  NA 129* 132* 137  K 3.9 3.8 4.3  CL 90* 99* 103  CO2 GLUCOSE 198* 205* 158*  BUN  39* 34* 23*  CREATININE 2.68* 1.99* 1.30*  CALCIUM 9.5 8.4* 8.8*   CBC:  Recent Labs Lab 02/04/17 2100 02/05/17 0538 02/06/17 0617  WBC 9.1 7.3 7.1  HGB 13.8 12.2* 12.2*  HCT 40.7 36.7* 36.9*  MCV 82.6 81.2 81.8  PLT 470* 371 389   Cardiac Enzymes:  Recent Labs Lab 02/04/17 2100 02/04/17 2343  TROPONINI <0.03 <0.03   CBG:  Recent Labs Lab 02/05/17 1658 02/05/17 2212 02/06/17 0428 02/06/17 0805 02/06/17 1143  GLUCAP 167* 146* 145* 238* 180*    Signed:  Vassie Loll MD.  [facility] 02/06/2017, 2:34 PM

## 2018-07-05 IMAGING — DX DG FOOT COMPLETE 3+V*R*
3 series · 3 of 3 positions shown · non-contrast
Comparison: None.

CLINICAL DATA: Right foot trauma with laceration to right pinky
toe, pain and swelling.

EXAM:
RIGHT FOOT COMPLETE - 3+ VIEW

[foot ap]
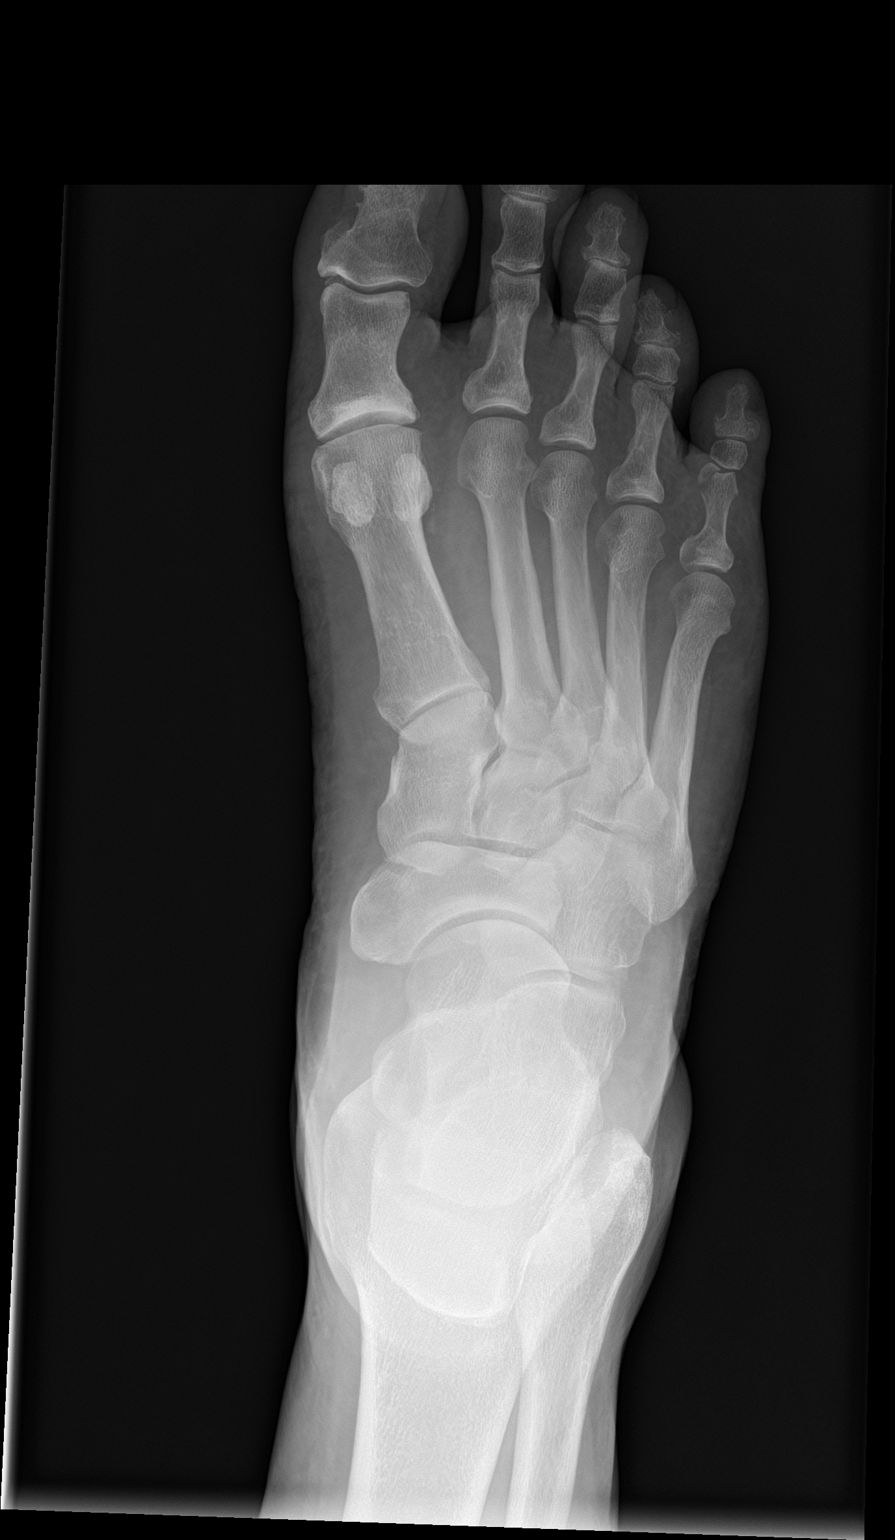

[foot obl]
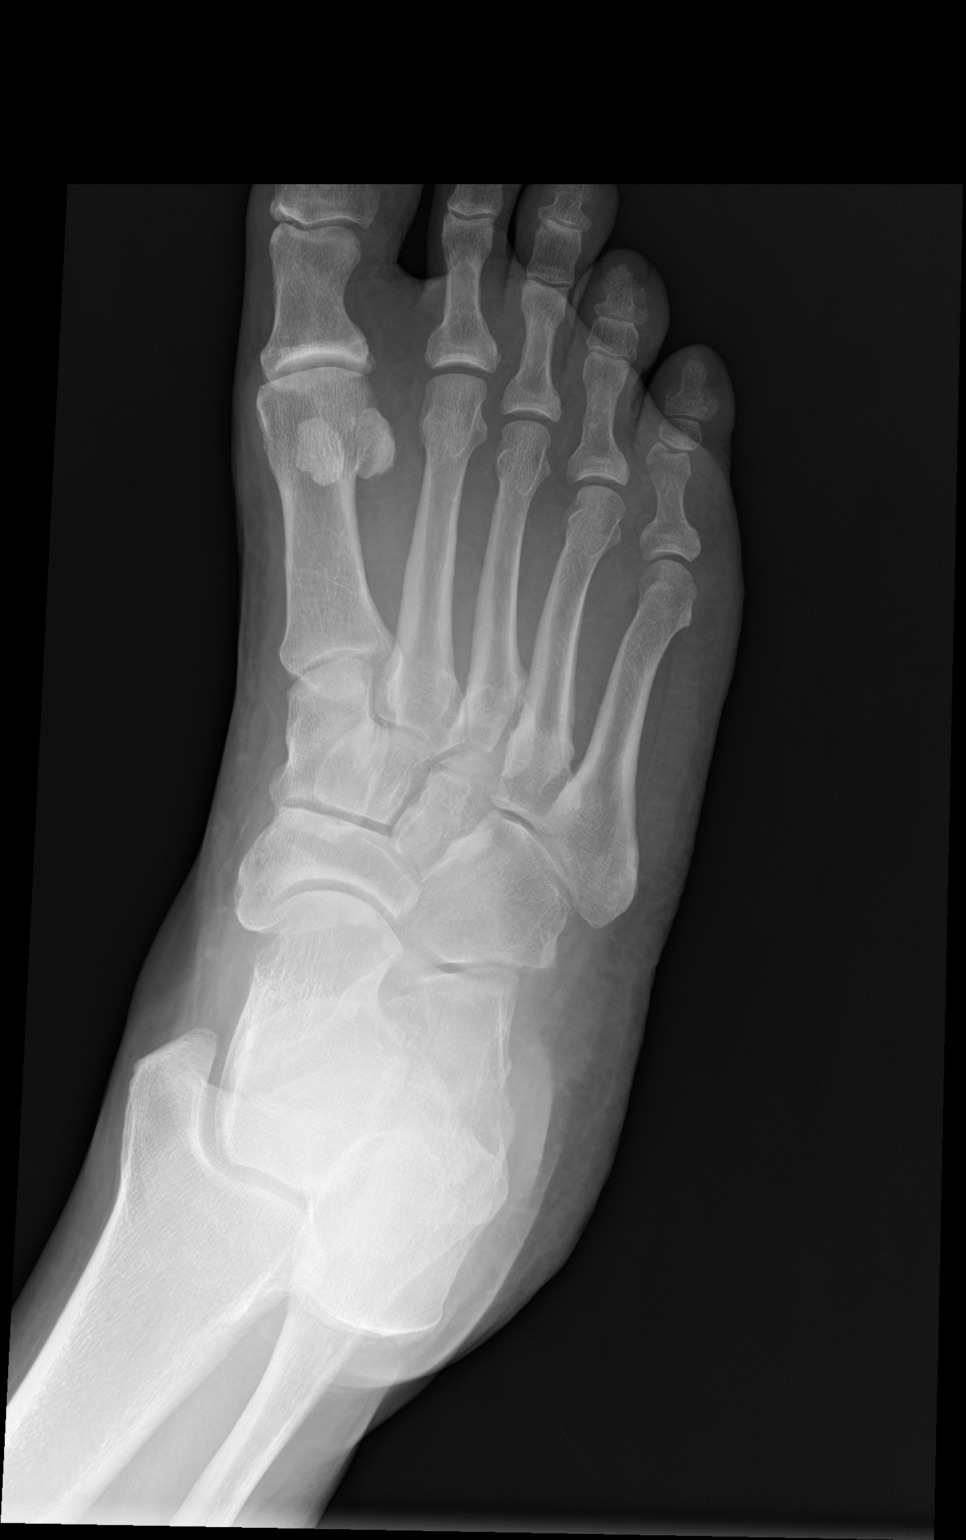

[foot lat]
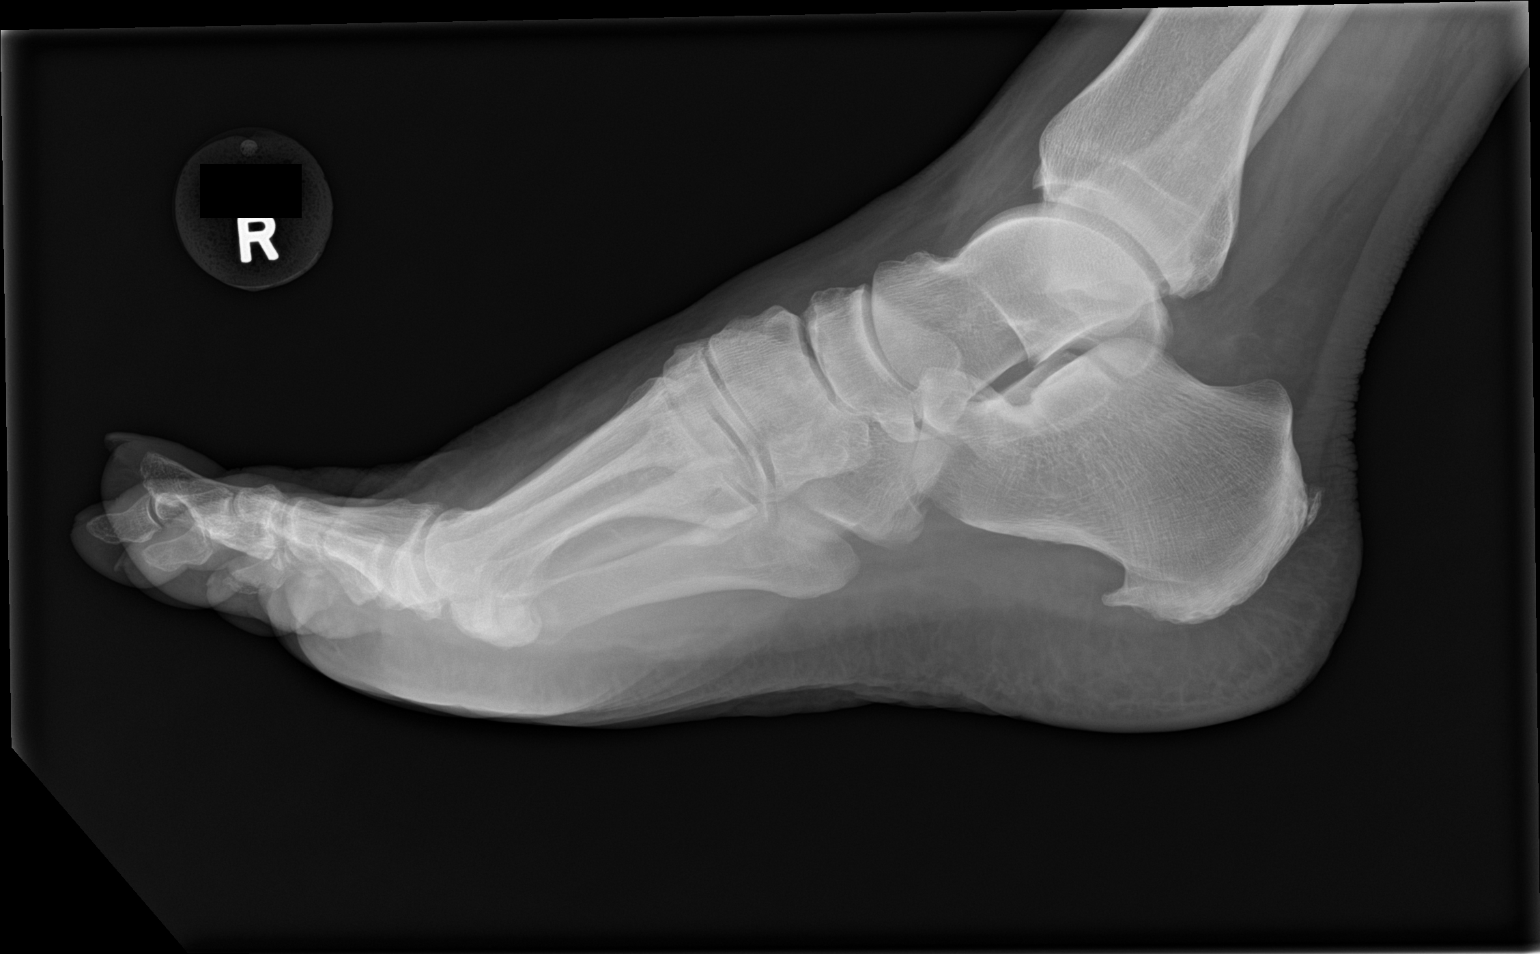

[3 of 3 positions shown; findings below may reference images not displayed]

FINDINGS: There is an acute closed fracture of the head of the right fifth
proximal phalanx along its medial aspect cough with extension of the
fracture into the PIP joint. No dislocation noted. Mild
osteoarthritic joint space narrowing of the first MTP articulation.
No radiopaque foreign body is noted. Calcaneal enthesophytes are
present along the plantar and dorsal aspect. The ankle, subtalar as
well as midfoot articulations are maintained.
IMPRESSION: Acute, closed, intra-articular fracture involving the head of right
fifth proximal phalanx extending into the PIP joint.

## 2018-07-05 IMAGING — CT CT HEAD W/O CM
3 series · 15 of 47 positions shown, 18 images · non-contrast
Comparison: None.

CLINICAL DATA: Dizziness and fall

EXAM:
CT HEAD WITHOUT CONTRAST
TECHNIQUE: Contiguous axial images were obtained from the base of the skull
through the vertex without intravenous contrast.

[Series 2: head wo · axial · 0.45mm/px · z∈[+74,+209]mm · 9 of 33 slices shown, 12 images]
[im 3/33  brain]
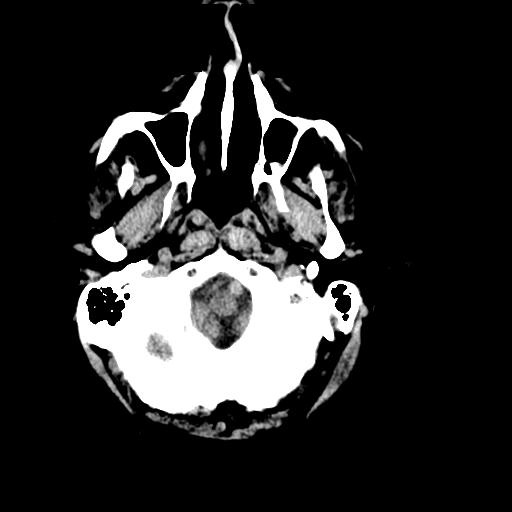
[im 3/33  bone]
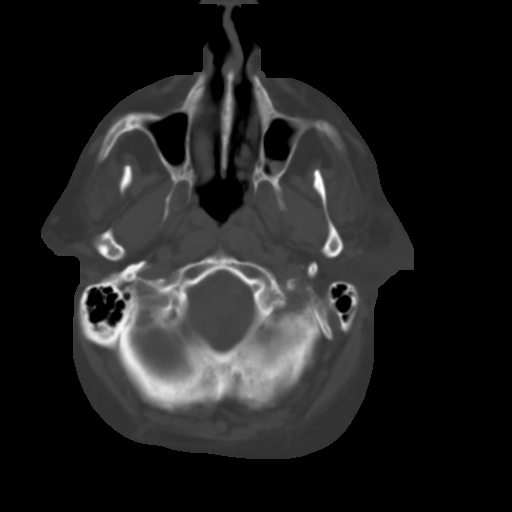
[im 6/33  brain]
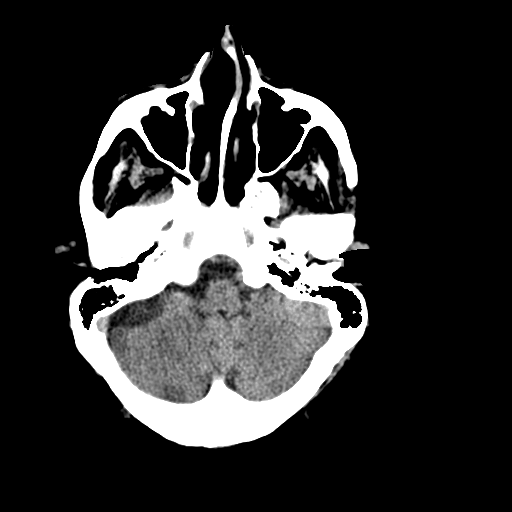
[im 9/33  brain]
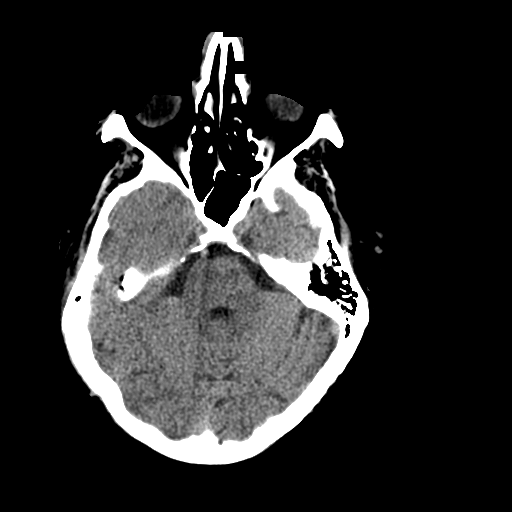
[im 13/33  brain]
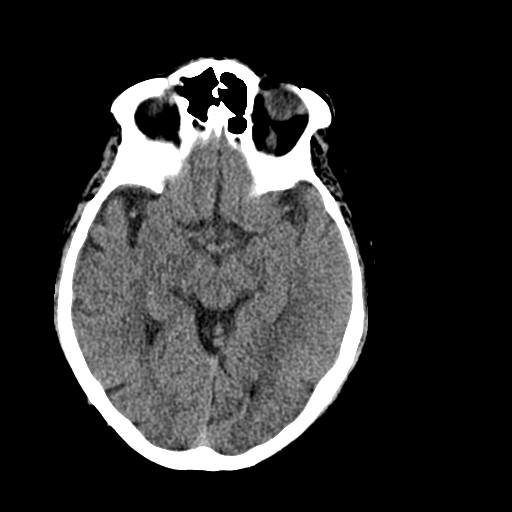
[im 17/33  brain]
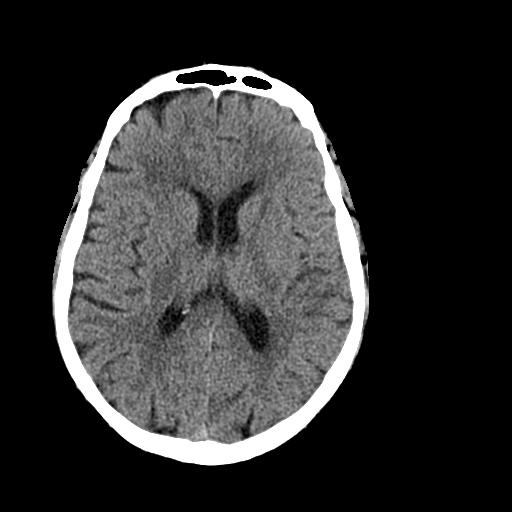
[im 17/33  bone]
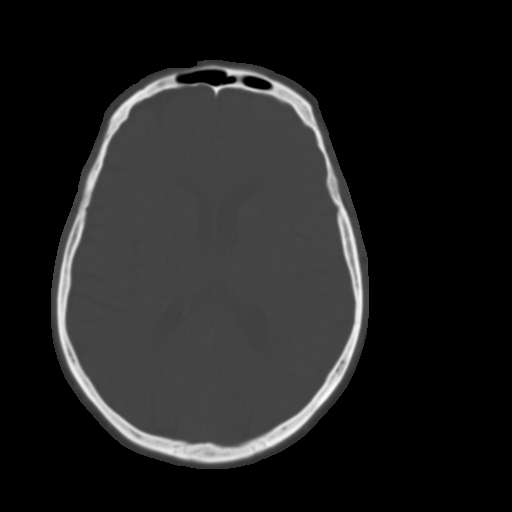
[im 20/33  brain]
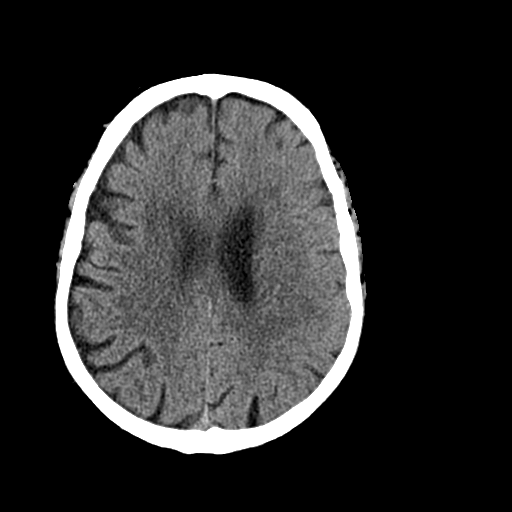
[im 24/33  brain]
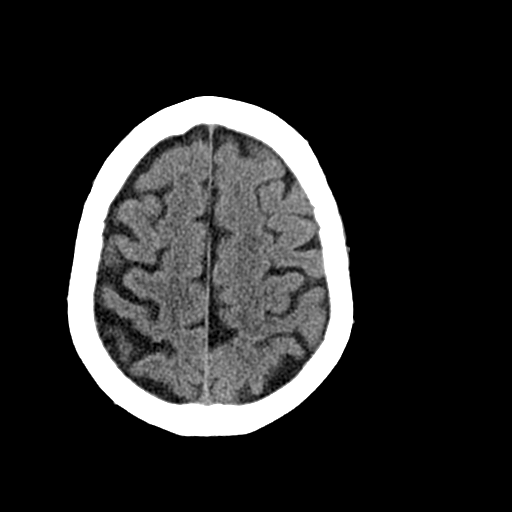
[im 27/33  brain]
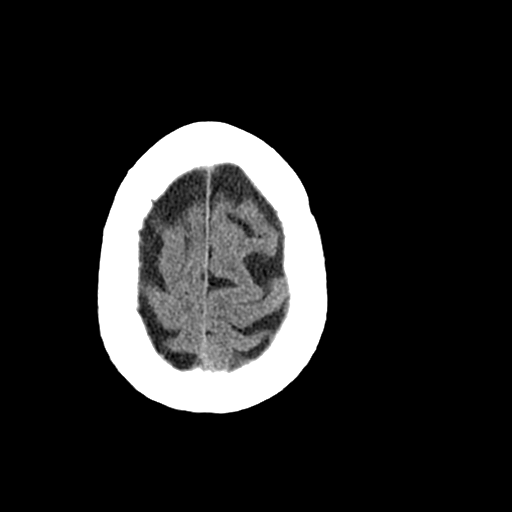
[im 30/33  brain]
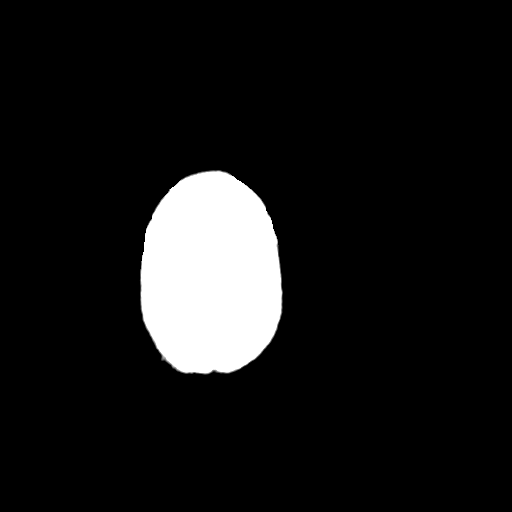
[im 30/33  bone]
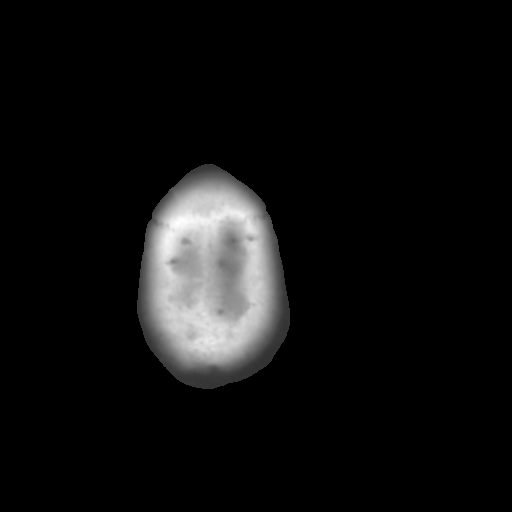

[Series 4: cor soft · coronal · 0.30mm/px · 3 of 84 slices shown]
[im 28/84  brain]
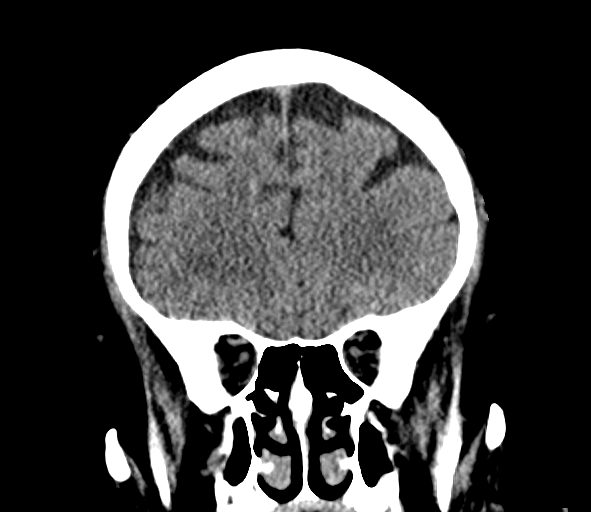
[im 37/84  brain]
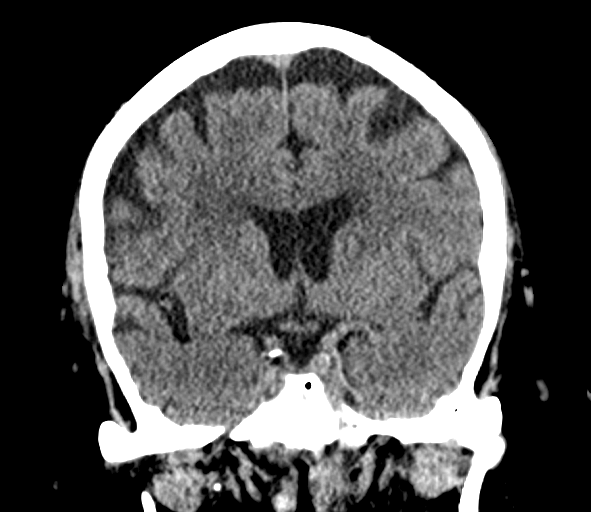
[im 47/84  brain]
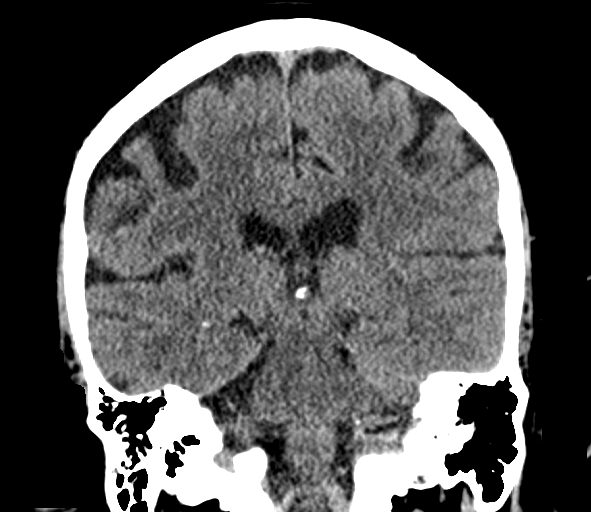

[Series 5: sag soft · sagittal · 0.35mm/px · 3 of 67 slices shown]
[im 23/67  brain]
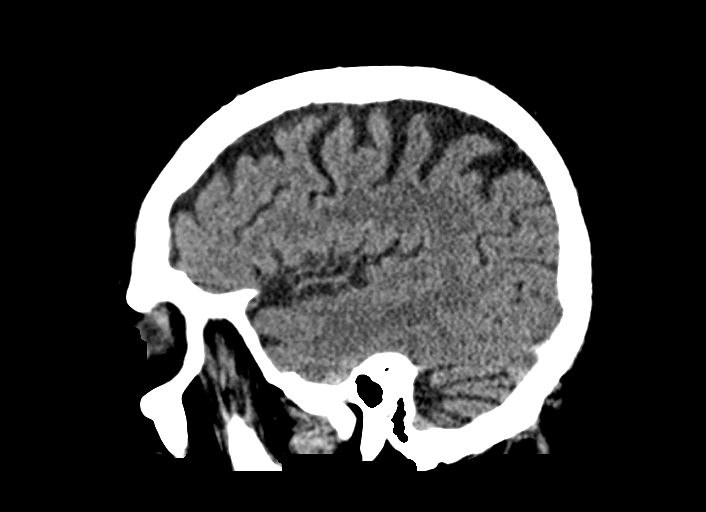
[im 34/67  brain]
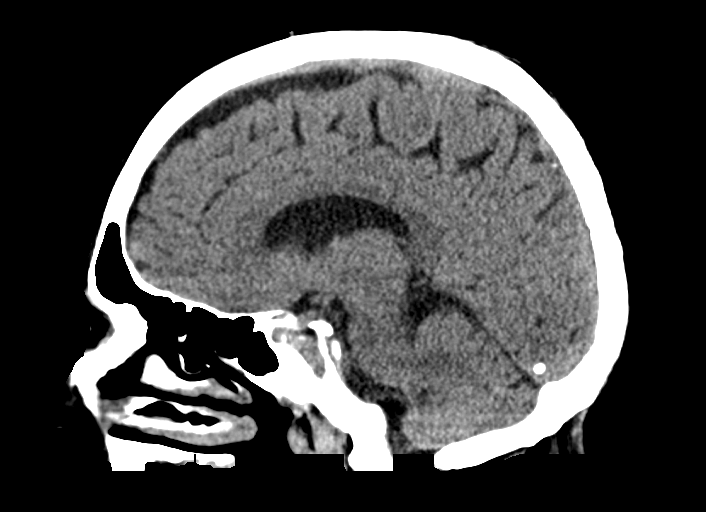
[im 45/67  brain]
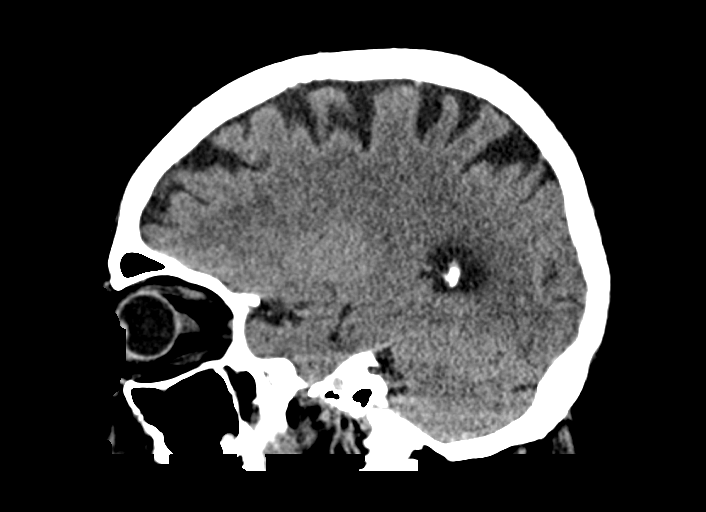

[15 of 47 positions shown; findings below may reference images not displayed]

FINDINGS: Brain: No mass lesion, intraparenchymal hemorrhage or extra-axial
collection. No evidence of acute cortical infarct. Brain parenchyma
and CSF-containing spaces are normal for age.

Vascular: No hyperdense vessel or unexpected calcification.

Skull: Normal visualized skull base, calvarium and extracranial soft
tissues.

Sinuses/Orbits: No sinus fluid levels or advanced mucosal
thickening. No mastoid effusion. Normal orbits.
IMPRESSION: Normal head CT for age.

## 2020-06-16 DEATH — deceased
